# Patient Record
Sex: Female | Born: 1950 | Race: White | Hispanic: No | Marital: Married | Smoking: Former smoker
Health system: Southern US, Community
[De-identification: ages and names within clinical notes are randomized; demographics above are authoritative.]

## PROBLEM LIST (undated history)

## (undated) DIAGNOSIS — IMO0001 Reserved for inherently not codable concepts without codable children: Secondary | ICD-10-CM

## (undated) DIAGNOSIS — M47814 Spondylosis without myelopathy or radiculopathy, thoracic region: Secondary | ICD-10-CM

## (undated) DIAGNOSIS — Z8679 Personal history of other diseases of the circulatory system: Secondary | ICD-10-CM

## (undated) DIAGNOSIS — J45909 Unspecified asthma, uncomplicated: Secondary | ICD-10-CM

## (undated) DIAGNOSIS — M1990 Unspecified osteoarthritis, unspecified site: Secondary | ICD-10-CM

## (undated) DIAGNOSIS — E785 Hyperlipidemia, unspecified: Secondary | ICD-10-CM

## (undated) DIAGNOSIS — I4510 Unspecified right bundle-branch block: Secondary | ICD-10-CM

## (undated) DIAGNOSIS — G5603 Carpal tunnel syndrome, bilateral upper limbs: Secondary | ICD-10-CM

## (undated) DIAGNOSIS — R0600 Dyspnea, unspecified: Secondary | ICD-10-CM

## (undated) DIAGNOSIS — E7800 Pure hypercholesterolemia, unspecified: Secondary | ICD-10-CM

## (undated) DIAGNOSIS — K219 Gastro-esophageal reflux disease without esophagitis: Secondary | ICD-10-CM

## (undated) DIAGNOSIS — I10 Essential (primary) hypertension: Secondary | ICD-10-CM

## (undated) DIAGNOSIS — D649 Anemia, unspecified: Secondary | ICD-10-CM

## (undated) DIAGNOSIS — F32A Depression, unspecified: Secondary | ICD-10-CM

## (undated) DIAGNOSIS — F419 Anxiety disorder, unspecified: Secondary | ICD-10-CM

## (undated) HISTORY — PX: APPENDECTOMY: SHX54

## (undated) HISTORY — PX: COLONOSCOPY: SHX174

## (undated) HISTORY — DX: Gastro-esophageal reflux disease without esophagitis: K21.9

## (undated) HISTORY — DX: Essential (primary) hypertension: I10

## (undated) HISTORY — PX: CHOLECYSTECTOMY: SHX55

## (undated) HISTORY — DX: Reserved for inherently not codable concepts without codable children: IMO0001

## (undated) HISTORY — PX: KNEE SURGERY: SHX244

## (undated) HISTORY — DX: Pure hypercholesterolemia, unspecified: E78.00

## (undated) HISTORY — PX: ANKLE SURGERY: SHX546

---

## 2000-02-12 ENCOUNTER — Other Ambulatory Visit: Admission: RE | Admit: 2000-02-12 | Discharge: 2000-02-12 | Payer: Self-pay | Admitting: Obstetrics and Gynecology

## 2001-03-15 ENCOUNTER — Other Ambulatory Visit: Admission: RE | Admit: 2001-03-15 | Discharge: 2001-03-15 | Payer: Self-pay | Admitting: Obstetrics and Gynecology

## 2001-11-04 ENCOUNTER — Encounter: Payer: Self-pay | Admitting: Pediatrics

## 2001-11-04 ENCOUNTER — Encounter: Admission: RE | Admit: 2001-11-04 | Discharge: 2001-11-04 | Payer: Self-pay | Admitting: Pediatrics

## 2001-11-17 ENCOUNTER — Inpatient Hospital Stay (HOSPITAL_COMMUNITY): Admission: EM | Admit: 2001-11-17 | Discharge: 2001-11-18 | Payer: Self-pay | Admitting: Emergency Medicine

## 2001-11-17 ENCOUNTER — Encounter (INDEPENDENT_AMBULATORY_CARE_PROVIDER_SITE_OTHER): Payer: Self-pay | Admitting: *Deleted

## 2001-11-17 ENCOUNTER — Encounter: Payer: Self-pay | Admitting: Emergency Medicine

## 2001-11-17 ENCOUNTER — Encounter: Payer: Self-pay | Admitting: Surgery

## 2002-03-31 ENCOUNTER — Other Ambulatory Visit: Admission: RE | Admit: 2002-03-31 | Discharge: 2002-03-31 | Payer: Self-pay | Admitting: Obstetrics and Gynecology

## 2002-12-18 ENCOUNTER — Encounter: Admission: RE | Admit: 2002-12-18 | Discharge: 2002-12-18 | Payer: Self-pay | Admitting: Internal Medicine

## 2002-12-18 ENCOUNTER — Encounter: Payer: Self-pay | Admitting: Internal Medicine

## 2003-05-31 ENCOUNTER — Other Ambulatory Visit: Admission: RE | Admit: 2003-05-31 | Discharge: 2003-05-31 | Payer: Self-pay | Admitting: Obstetrics and Gynecology

## 2003-10-02 ENCOUNTER — Ambulatory Visit (HOSPITAL_COMMUNITY): Admission: RE | Admit: 2003-10-02 | Discharge: 2003-10-02 | Payer: Self-pay | Admitting: Orthopedic Surgery

## 2003-10-02 ENCOUNTER — Ambulatory Visit (HOSPITAL_BASED_OUTPATIENT_CLINIC_OR_DEPARTMENT_OTHER): Admission: RE | Admit: 2003-10-02 | Discharge: 2003-10-02 | Payer: Self-pay | Admitting: Orthopedic Surgery

## 2004-07-02 ENCOUNTER — Other Ambulatory Visit: Admission: RE | Admit: 2004-07-02 | Discharge: 2004-07-02 | Payer: Self-pay | Admitting: Obstetrics and Gynecology

## 2005-06-15 ENCOUNTER — Ambulatory Visit (HOSPITAL_COMMUNITY): Admission: RE | Admit: 2005-06-15 | Discharge: 2005-06-15 | Payer: Self-pay | Admitting: Gastroenterology

## 2005-07-06 ENCOUNTER — Other Ambulatory Visit: Admission: RE | Admit: 2005-07-06 | Discharge: 2005-07-06 | Payer: Self-pay | Admitting: Obstetrics and Gynecology

## 2005-08-20 ENCOUNTER — Emergency Department (HOSPITAL_COMMUNITY): Admission: EM | Admit: 2005-08-20 | Discharge: 2005-08-20 | Payer: Self-pay | Admitting: Emergency Medicine

## 2006-09-14 ENCOUNTER — Other Ambulatory Visit: Admission: RE | Admit: 2006-09-14 | Discharge: 2006-09-14 | Payer: Self-pay | Admitting: Obstetrics and Gynecology

## 2007-02-28 ENCOUNTER — Encounter: Admission: RE | Admit: 2007-02-28 | Discharge: 2007-02-28 | Payer: Self-pay | Admitting: Internal Medicine

## 2007-10-26 ENCOUNTER — Other Ambulatory Visit: Admission: RE | Admit: 2007-10-26 | Discharge: 2007-10-26 | Payer: Self-pay | Admitting: Obstetrics and Gynecology

## 2008-02-16 ENCOUNTER — Encounter: Admission: RE | Admit: 2008-02-16 | Discharge: 2008-02-16 | Payer: Self-pay | Admitting: Internal Medicine

## 2009-01-02 ENCOUNTER — Other Ambulatory Visit: Admission: RE | Admit: 2009-01-02 | Discharge: 2009-01-02 | Payer: Self-pay | Admitting: Obstetrics and Gynecology

## 2009-01-02 ENCOUNTER — Ambulatory Visit: Payer: Self-pay | Admitting: Obstetrics and Gynecology

## 2009-01-02 ENCOUNTER — Encounter: Payer: Self-pay | Admitting: Obstetrics and Gynecology

## 2009-04-10 ENCOUNTER — Ambulatory Visit: Payer: Self-pay | Admitting: Obstetrics and Gynecology

## 2009-12-17 ENCOUNTER — Ambulatory Visit: Payer: Self-pay | Admitting: Obstetrics and Gynecology

## 2010-02-12 ENCOUNTER — Encounter: Admission: RE | Admit: 2010-02-12 | Discharge: 2010-02-12 | Payer: Self-pay | Admitting: Internal Medicine

## 2010-02-12 ENCOUNTER — Encounter (INDEPENDENT_AMBULATORY_CARE_PROVIDER_SITE_OTHER): Payer: Self-pay | Admitting: Surgery

## 2010-02-12 ENCOUNTER — Observation Stay (HOSPITAL_COMMUNITY): Admission: EM | Admit: 2010-02-12 | Discharge: 2010-02-13 | Payer: Self-pay | Admitting: Emergency Medicine

## 2010-07-08 ENCOUNTER — Ambulatory Visit: Payer: Self-pay | Admitting: Obstetrics and Gynecology

## 2010-07-08 ENCOUNTER — Other Ambulatory Visit: Admission: RE | Admit: 2010-07-08 | Discharge: 2010-07-08 | Payer: Self-pay | Admitting: Obstetrics and Gynecology

## 2011-03-04 ENCOUNTER — Ambulatory Visit (INDEPENDENT_AMBULATORY_CARE_PROVIDER_SITE_OTHER): Payer: Commercial Managed Care - PPO | Admitting: Obstetrics and Gynecology

## 2011-03-04 DIAGNOSIS — R5381 Other malaise: Secondary | ICD-10-CM

## 2011-03-04 DIAGNOSIS — N951 Menopausal and female climacteric states: Secondary | ICD-10-CM

## 2011-04-24 NOTE — Op Note (Signed)
[name], [name]                           ACCOUNT NO.:  [id number]   MEDICAL RECORD NO.:  [id number]                   PATIENT TYPE:  AMB   LOCATION:  DSC                                  FACILITY:  [facility]   PHYSICIAN:  Leonides Reeves, M.D.                  DATE OF BIRTH:  08-07-1951   DATE OF PROCEDURE:  10/02/2003  DATE OF DISCHARGE:                                 OPERATIVE REPORT   PREOPERATIVE DIAGNOSES:  1. Right anterior distal tibial spur.  2. Right dorsal talar neck spur.  3. Right fibular spur.  4. Right ankle loose body.  5. Right ankle impingement.  6. Right ankle instability.   POSTOPERATIVE DIAGNOSES:  1. Right anterior distal tibial spur.  2. Right dorsal talar neck spur.  3. Right fibular spur.  4. Right ankle loose body.  5. Right ankle impingement.  6. Right ankle instability.   OPERATION/PROCEDURE:  1. Right ankle arthroscopy with extensive debridement.  2. Right excision tibial spurs.  3. Right excision talar spurs.  4. Right excision fibular spurs.  5. Right excision loose body.  6. Right modified Brostrom procedure.   ANESTHESIA:  General endotracheal.   SURGEON:  Leonides Reeves, M.D.   ASSISTANT:  Lianne Cure, P.A.   ESTIMATED BLOOD LOSS:  Minimal.   TOURNIQUET TIME:  Approximately two hours.   COMPLICATIONS:  None.   DISPOSITION:  Stable to PR.   INDICATIONS:  This is a 60 year old very pleasant female who has had  persistent pain and instability involving her ankle to the point that it was  interfering with her life.  She has consented for the above procedure.  All  risks which include infection, nerve and vessel injury, persistent pain,  worsening pain, stiffness, arthritis and recurrence of instability and pain  were all explained.  Questions were encouraged and answered.   OPERATION:  The patient was brought to the operating room and placed in the  supine position after adequate general endotracheal tube anesthesia was  administered as well as Ancef 1 g IV piggyback.  The right lower extremity  was then prepped and draped in a sterile manner over a proximally placed  thigh tourniquet.  The limb was gravity exsanguinated and the tourniquet was  elevated to 290 mmHg.  We then placed about 20 cc of normal saline into the  ankle.  Anatomical landmarks were then marked out, ie., anterior tibialis  and superficial peroneal nerve.  The anteromedial portal was then created  with the nick and spread technique just medial to the anterior tibialis  tendon.  Dissection was carried down, nick and spread technique down through  capsule.  Blunt-tipped trocar followed by the camera was then introduced  under direct visualization.  The anterolateral portal was then created with  the nick and spread technique after a spinal needle was introduced.  There  was a large amount of  synovitis and loose body and as well as osteophytes  present.  The synovitis was then debrided with a shaver.  The loose body was  then removed with a grabber.  The accessory tib-fib ligament was also  rubbing against the corner of the talar dome and this was removed with a  grabber as well.  The anterior distal tibial osteophytes as well as the  dorsal talar osteophytes were removed with the curved 0.25 inch osteotome as  well as a shaver.  Gutters were then debrided as well.  Once this was  completely done and meticulously done, we then removed the scope and then  made a curvilinear incision anterior to the fibula on the right ankle.  Dissection was carried down through skin.  A large amount of adipose tissue  in this area.  This was meticulously dissected out of harm's way.  The  superficial peroneal nerve was identified and retracted out of harms way as  well, a branch of it.  We then did a capsulotomy about 1-2 mm from the  anterior border of the fibula.  The capsule was then removed.  The  osteophytes were then removed off the fibula and  especially off the tip of  the fibula where it was impinging against the lateral as well as and against  the lateral process of the talus.  This was then removed with a curved 0.25  inch osteotome and rongeur.  Once this was done, we then created a bed of  bone against the anterior aspect of the fibula.  Two suture anchors were  then placed in line with the calcaneal fibular ligament as well as the  anterior talofibular ligament.  The capsule was then advanced with the ankle  in dorsiflexion eversion and sutured down with 2-0 Fibrewire after suture  anchors were placed in each hole, respectively.  The remaining portion of  the capsule was then repaired with 2-0 Fibrewire.  There was no significant  extensor retinaculum that we were able to elevate and move over to augment  the repair.  The ankle was then ranged and stability was checked and it was  very stable.  Prior to capsular closure also the joint was copiously  irrigated with normal saline as well.  The tourniquet was deflated.  Hemostasis was obtained. A 3-0 Vicryl was used to close the subcutaneous.  The skin was closed with 4-0 nylon.  Sterile dressing was applied.  Modified  Jones dressing was applied.  The patient was stable to the PR.                                                 Leonides Reeves, M.D.    PB/MEDQ  D:  10/02/2003  T:  10/02/2003  Job:  [id number]

## 2011-04-24 NOTE — Op Note (Signed)
NAMEMIZUKI, HOEL                 ACCOUNT NO.:  [id number]   MEDICAL RECORD NO.:  [id number]          PATIENT TYPE:  AMB   LOCATION:  ENDO                         FACILITY:  [facility]   PHYSICIAN:  Danise Edge, M.D.   DATE OF BIRTH:  [birth year]   DATE OF PROCEDURE:  06/15/2005  DATE OF DISCHARGE:                                 OPERATIVE REPORT   PROCEDURE:  Screening colonoscopy.   PROCEDURE INDICATIONS:  [name] is a 60 year old female born Nov 11, 1951. Ms. [name] is scheduled to undergo her first screening colonoscopy  with polypectomy to prevent colon cancer. I discussed with her the  complications associated with colonoscopy and polypectomy including a 15 per  1000 risk of bleeding and 1 per 1000 risk of colon perforation. Ms. [name] has  signed the operative permit.   ENDOSCOPIST:  Danise Edge, M.D.   PREMEDICATION:  Versed 10 mg, Demerol 80 mg.   PROCEDURE:  After obtaining informed consent, Ms. [name] was placed in the left  lateral decubitus position. I administered intravenous Demerol and  intravenous Versed to achieve conscious sedation for the procedure. The  patient's blood pressure, oxygen saturation, and cardiac rhythm were  monitored throughout the procedure and documented in the medical record.   Anal inspection and digital rectal exam were normal. The Olympus adjustable  pediatric colonoscope was introduced into the rectum and advanced to the  cecum. Colonic preparation for the exam today was excellent.   Rectum normal. Retroflexed view of the distal rectum normal.  Sigmoid colon and descending colon. Sigmoid colonic diverticulosis without  diverticulitis. No neoplasia.  Splenic flexure normal.  Transverse colon normal.  Hepatic flexure normal.  Ascending colon normal.  Cecum and ileocecal valve normal.   ASSESSMENT:  Normal screening proctocolonoscopy to cecum. Sigmoid colonic  diverticulosis present.       MJ/MEDQ  D:  06/15/2005  T:   06/15/2005  Job:  [id number]   cc:   Tiffany Housekeeper, MD  [address]  [place]  [place] [address]  Fax: [phone]

## 2011-04-24 NOTE — H&P (Signed)
[facility]  Patient:    BLESSING, ZAUCHA Visit Number: [id number] MRN: [id number]          Service Type: SUR Location: 1E 0110 01 Attending Physician:  Donnetta Hutching Dictated by:   Currie Paris, M.D. Admit Date:  11/17/2001                           History and Physical  VISIT NUMBER:  [id number]  CHIEF COMPLAINT:  Abdominal pain.  CLINICAL HISTORY:  This patient is a healthy 60 year old woman who has presented with an acute episode of abdominal pain.  It woke her up about 3 a.m., although the evening prior she had a little bit of mild upper abdominal discomfort and indigestion, but was fine when she went to bed.  She woke up at 3 a.m. with sharp severe pain that was subxiphoid and radiated down into the right upper quadrant and around to her back.  It was associated with some nausea and one episode of emesis about three hours after the pain began. She presented to the emergency room about 7:45 a.m. and subsequently the pain resolved spontaneously without need for pain medication.  She still feels a little queasy.  The patient has had some mild symptoms in the past and has been diagnosed with a hiatal hernia and reflux.  A couple of years ago, she had to have an esophageal dilation.  However, she really has not had too much in the way of biliary colic-type symptoms.  PAST MEDICAL HISTORY:  Operations:  None.  MEDICATIONS:  She is on two blood pressure pills.  She takes occasional Pepcid type drugs as well.  ALLERGIES:  CODEINE makes her feel funny.  HABITS:  Smoking:  None.  Alcohol:  None.  FAMILY HISTORY:  The patients mother is an insulin-dependent diabetic.  REVIEW OF SYSTEMS:  HEENT:  Basically negative.  Chest:  No cough or shortness of breath.  Heart:  History of some  hypertension, otherwise negative. Abdomen:  Negative, except for HPI.  GU:  Negative.  Extremities:  Negative.  PHYSICAL EXAMINATION:  The patient is a  healthy-appearing, 60 year old, alert, awake, and in no distress.  HEENT:  Normocephalic.  Eyes nonicteric.  Pupils round and reactive.  NECK:  Supple.  No masses or thyromegaly.  LUNGS:  Clear to auscultation.  HEART:  Regular.  No murmurs, rubs, or gallops.  ABDOMEN:  Soft, flat.  Perhaps a little involuntary guarding in the right upper quadrant, but the patient denies tenderness.  Bowel sounds are normal. There are no masses or organomegaly.  EXTREMITIES:  She does have some varicosities.  LABORATORY STUDIES:  These show a specific gravity of 1.025.  The white count is 13,000.  The hemoglobin is normal.  Liver functions are normal.  The ultrasound of the gallbladder shows what appears to be a fairly large stone perhaps at the neck of the gallbladder.  The common duct is "plump" at 9 mm.  There appears to be a little thickening of the gallbladder wall.  The gallbladder is not particularly dilated.  IMPRESSION:  Acute biliary colic with probably developing cholecystitis given the white count and thickened wall.  PLAN:  I think we ought to go ahead with laparoscopic cholecystectomy today. I have discussed the indications, risks, and complications of the surgery, including bleeding, infections, conversion to open, bile duct injuries, bile leaks, etc.  I think all questions have been answered.  She  would like to go ahead and proceed to surgery.  We will go ahead and make those arrangements, start her on some antibiotics preoperatively, and proceed. Dictated by:   Currie Paris, M.D. Attending Physician:  Donnetta Hutching DD:  11/17/01 TD:  11/17/01 Job: [id number]

## 2011-04-24 NOTE — Op Note (Signed)
[facility]  Patient:    [name], [name] Visit Number: [id number] MRN: [id number]          Service Type: SUR Location: 4W 0445 02 Attending Physician:  Charlton Haws Dictated by:   Currie Paris, M.D. Proc. Date: 11/17/01 Admit Date:  11/17/2001                             Operative Report  PREOPERATIVE DIAGNOSIS:  Acute calculous cholecystitis.  POSTOPERATIVE DIAGNOSIS:  Acute calculous cholecystitis.  OPERATION PERFORMED:  Laparoscopic cholecystectomy with the operative cholangiogram.  SURGEON:  Currie Paris, M.D.  ASSISTANT:  Dr. Ezzard Standing.  ANESTHESIA:  General endotracheal.  CLINICAL HISTORY:  This patient is a 60 year old who presented with an episode of acute biliary pain this morning that began at 3 a.m. and woke her up. Evaluation in the emergency room showed normal liver functions and her pain spontaneously went away; however, her ultrasound showed a single large stone that looked like it was in the neck of the gallbladder and slight thickening of the gallbladder wall and she had a white count of 13,000. It was elected to start her on some antibiotics and take her to the operating room for cholecystectomy.  DESCRIPTION OF PROCEDURE:  The patient was brought to the operating room and after satisfactory general endotracheal anesthesia had been obtained, the abdomen was prepped and draped. The 0.25% plain Marcaine was used for each incision. The umbilical incision was made first, the fascia opened, and the peritoneal cavity entered. A pursestring was placed and the Hasson introduced. The abdomen was insufflated to 15.  The patient was placed reverse Trendelenburg and tilted to the left. A 10 mm trocar was placed in the epigastrium and two 5s laterally. The gallbladder was tense and edematous but not severely inflamed consistent with an early obstruction of the cystic duct and early inflammation.  The cystic duct  area was opened up opening the peritoneum and the triangle of Calot dissected out so that I could clearly see the cystic duct in its junction with the common duct and gallbladder. The cystic artery was seen posteriorly and a single clip was placed on it once the anatomy had been identified.  Because the common duct looked somewhat large on ultrasound, we decided to go ahead with the cholangiogram. A clip was placed at the cystic duct gallbladder junction and the cystic duct was opened. A Reddick catheter was introduced and operative cholangiography was done using C-arm. There was good flow into the duodenum, good filling of the hepatic radicles and no evidence of any intraluminal filling defects.  The cystic duct catheter was removed and two clips placed on the cystic duct and it was divided. The cystic artery had two additional clips placed and it was divided leaving two clips on the stay side. The gallbladder was removed from below to above with coagulation current of the cautery checking as we went to make sure there was no bleeding along the bed of the gallbladder.  Just prior to disconnecting, we irrigated and began checking for hemostasis and everything appeared dry. The gallbladder was disconnected and put into an endocatch bag. It was brought out the umbilical port.  The abdomen with reinsufflated with my finger occluding the umbilical port so that we could again make a last check for bleeding a last irrigation. Everything looked okay. The lateral ports were removed and did not bleed. The umbilical port was  closed with the previously placed pursestring and was free of any bowel. The abdomen was deflated through the epigastric port.  The skin incisions were closed with 4-0 monocryl subcuticular plus Steri-Strips. The patient tolerated the procedure well. There were no operative complications. Dictated by:   Currie Paris, M.D. Attending Physician:  Charlton Haws DD:  11/17/01 TD:  11/18/01 Job: [id number]

## 2011-09-09 ENCOUNTER — Telehealth: Payer: Self-pay | Admitting: *Deleted

## 2011-09-09 NOTE — Telephone Encounter (Signed)
Lm for patient to call back about message she left about getting HRT.

## 2011-09-10 ENCOUNTER — Telehealth: Payer: Self-pay | Admitting: *Deleted

## 2011-09-10 MED ORDER — ESTRADIOL 0.05 MG/24HR TD PTWK: 1.0000 | MEDICATED_PATCH | TRANSDERMAL | Status: DC

## 2011-09-10 MED ORDER — MEDROXYPROGESTERONE ACETATE 5 MG PO TABS: ORAL_TABLET | ORAL | Status: DC

## 2011-09-10 NOTE — Telephone Encounter (Signed)
Patient was calling about two rx's that was given to her on 03/04/11 visit.  They were Climara 0.05 patches and Medroxyprogesterone 5mg  daily 1-12. Patient said she never started them, and lost rx's.  Patient is ready to start them.  Has an annual exam set up for November, and wants to know if we could call in rx's till annual exam and see if they would work for her, if not it could be changed when she comes in for annual exam. Is that ok?

## 2011-09-10 NOTE — Telephone Encounter (Signed)
Lm on patient voice mail with note below with instructions.

## 2011-09-10 NOTE — Telephone Encounter (Signed)
yes

## 2011-10-08 ENCOUNTER — Encounter: Payer: Self-pay | Admitting: Gynecology

## 2011-10-08 DIAGNOSIS — I10 Essential (primary) hypertension: Secondary | ICD-10-CM | POA: Insufficient documentation

## 2011-10-26 ENCOUNTER — Encounter: Payer: Commercial Managed Care - PPO | Admitting: Obstetrics and Gynecology

## 2011-12-07 ENCOUNTER — Ambulatory Visit (INDEPENDENT_AMBULATORY_CARE_PROVIDER_SITE_OTHER): Payer: Commercial Managed Care - PPO | Admitting: Obstetrics and Gynecology

## 2011-12-07 ENCOUNTER — Other Ambulatory Visit (HOSPITAL_COMMUNITY)
Admission: RE | Admit: 2011-12-07 | Discharge: 2011-12-07 | Disposition: A | Discharge status: Home / Self Care | Payer: Commercial Managed Care - PPO | Source: Ambulatory Visit | Attending: Obstetrics and Gynecology | Admitting: Obstetrics and Gynecology

## 2011-12-07 ENCOUNTER — Encounter: Payer: Self-pay | Admitting: Obstetrics and Gynecology

## 2011-12-07 VITALS — BP 120/76 | Ht 64.5 in | Wt 203.0 lb

## 2011-12-07 DIAGNOSIS — Z01419 Encounter for gynecological examination (general) (routine) without abnormal findings: Secondary | ICD-10-CM

## 2011-12-07 DIAGNOSIS — E7800 Pure hypercholesterolemia, unspecified: Secondary | ICD-10-CM | POA: Insufficient documentation

## 2011-12-07 MED ORDER — MEDROXYPROGESTERONE ACETATE 2.5 MG PO TABS: 2.5000 mg | ORAL_TABLET | Freq: Every day | ORAL | Status: DC

## 2011-12-07 MED ORDER — ESTRADIOL 0.05 MG/24HR TD PTWK: 1.0000 | MEDICATED_PATCH | TRANSDERMAL | Status: DC

## 2011-12-07 NOTE — Progress Notes (Signed)
The patient came to see me today for her annual GYN exam. She is found a significant reduction in hot flashes on her HRT but it has not helped her fatigue which she was hoping it would. She is not happy with the heaviness of her periods on cyclical progesterone. She continues with yearly mammograms. She is having no pelvic pain. She does her lab work from her PCP.  Physical examination: [name] present. HEENT within normal limits. Neck: Thyroid not large. No masses. Supraclavicular nodes: not enlarged. Breasts: Examined in both sitting midline position. No skin changes and no masses. Abdomen: Soft no guarding rebound or masses or hernia. Pelvic: External: Within normal limits. BUS: Within normal limits. Vaginal:within normal limits. Good estrogen effect. No evidence of cystocele rectocele or enterocele. Cervix: clean. Uterus: Normal size and shape. Adnexa: No masses. Rectovaginal exam: Confirmatory and negative. Extremities: Within normal limits.  Assessment: Menopausal symptoms  Plan: Patient would like to continue HRT for hot flashes relief. We switched her to a continuous progestin 2.5 mg daily to eliminate the bleeding. She will continue her yearly mammograms.

## 2011-12-15 NOTE — Progress Notes (Signed)
Addended by: Melvenia Beam on: 12/15/2011 08:45 AM   Modules accepted: Orders

## 2012-08-19 ENCOUNTER — Telehealth: Payer: Self-pay | Admitting: *Deleted

## 2012-08-19 ENCOUNTER — Other Ambulatory Visit: Payer: Self-pay | Admitting: Women's Health

## 2012-08-19 DIAGNOSIS — B373 Candidiasis of vulva and vagina: Secondary | ICD-10-CM

## 2012-08-19 MED ORDER — FLUCONAZOLE 150 MG PO TABS: 150.0000 mg | ORAL_TABLET | Freq: Once | ORAL | Status: AC

## 2012-08-19 NOTE — Telephone Encounter (Signed)
(  Dr.G patient) Pt calling c/o yeast infection after being on antibody, pt cant make OV today and would like something to relieve over weekend. If no relief they OV will be made for next. Please advise

## 2012-08-19 NOTE — Telephone Encounter (Signed)
Unable to breech, left message on cell Diflucan will be called into her pharmacy. Encouraged office visit if no relief.

## 2012-08-22 NOTE — Telephone Encounter (Signed)
Telephone call for followup states did pick up prescription for Diflucan and is feeling better. Reviewed if symptoms not 100% office visit prefer to.

## 2012-10-07 ENCOUNTER — Encounter: Payer: Self-pay | Admitting: Cardiology

## 2013-01-13 ENCOUNTER — Encounter: Payer: Commercial Managed Care - PPO | Admitting: Gynecology

## 2013-02-22 ENCOUNTER — Encounter: Payer: Commercial Managed Care - PPO | Admitting: Gynecology

## 2013-03-28 ENCOUNTER — Telehealth: Payer: Self-pay

## 2013-03-28 ENCOUNTER — Encounter: Payer: Self-pay | Admitting: Gynecology

## 2013-03-28 NOTE — Telephone Encounter (Signed)
I called patient again. This time we were able to talk. 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  She is scheduled for hip replacement next month and too much pain for that position. She said she can get on the table with no problem and lying back no problem. I told her to come on for visit and just let her nurse know this.  Dr. Glenetta Hew will do what he can do with what she is comfortable doing.

## 2013-03-28 NOTE — Telephone Encounter (Signed)
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

## 2013-03-28 NOTE — Telephone Encounter (Signed)
Patient called stating she had a question.  Sherrilyn Rist called her several times and left message for her to call. She is in my voice mail at this point.

## 2013-03-30 ENCOUNTER — Other Ambulatory Visit: Payer: Self-pay | Admitting: Gynecology

## 2013-03-30 ENCOUNTER — Ambulatory Visit (INDEPENDENT_AMBULATORY_CARE_PROVIDER_SITE_OTHER): Payer: Commercial Managed Care - PPO | Admitting: Gynecology

## 2013-03-30 ENCOUNTER — Encounter: Payer: Self-pay | Admitting: Gynecology

## 2013-03-30 ENCOUNTER — Other Ambulatory Visit (HOSPITAL_COMMUNITY)
Admission: RE | Admit: 2013-03-30 | Discharge: 2013-03-30 | Disposition: A | Discharge status: Home / Self Care | Payer: Commercial Managed Care - PPO | Source: Ambulatory Visit | Attending: Gynecology | Admitting: Gynecology

## 2013-03-30 VITALS — BP 130/86 | Ht 64.5 in | Wt 205.0 lb

## 2013-03-30 DIAGNOSIS — Z01419 Encounter for gynecological examination (general) (routine) without abnormal findings: Secondary | ICD-10-CM | POA: Insufficient documentation

## 2013-03-30 DIAGNOSIS — Z1151 Encounter for screening for human papillomavirus (HPV): Secondary | ICD-10-CM | POA: Insufficient documentation

## 2013-03-30 DIAGNOSIS — N950 Postmenopausal bleeding: Secondary | ICD-10-CM | POA: Insufficient documentation

## 2013-03-30 NOTE — Patient Instructions (Addendum)

## 2013-03-30 NOTE — Progress Notes (Signed)
[name] November 14, 1951 [id number]   History:    62 y.o.  for annual gyn exam who has been complaining of postmenopausal bleeding. Patient was on hormone replacement therapy for 2 months early in 2013 and discontinued because of heavy bleeding. She stated that since the fall of 2013 on no hormone replacement therapy when she wiped she did notice a spotting. Review of her record indicated that her FSH was elevated in 2011 with a value of 40. Her primary care physician is Dr. Eula Listen who is doing her lab work. Patient scheduled for a right hip replacement later next month. Patient would know prior bone density study. Last mammogram was normal in 2013. Patient's last Pap smear in 2012. Patient denies any past history of abnormal Pap smears.  Past medical history,surgical history, family history and social history were all reviewed and documented in the EPIC chart.  Gynecologic History No LMP recorded. Patient is postmenopausal. Contraception: post menopausal status Last Pap: 2012. Results were: normal Last mammogram: 2013. Results were: normal  Obstetric History OB History   Grav Para Term Preterm Abortions TAB SAB Ect Mult Living   1 1 1       1      # Outc Date GA Lbr Len/2nd Wgt Sex Del Anes PTL Lv   1 TRM                ROS: A ROS was performed and pertinent positives and negatives are included in the history.  GENERAL: No fevers or chills. HEENT: No change in vision, no earache, sore throat or sinus congestion. NECK: No pain or stiffness. CARDIOVASCULAR: No chest pain or pressure. No palpitations. PULMONARY: No shortness of breath, cough or wheeze. GASTROINTESTINAL: No abdominal pain, nausea, vomiting or diarrhea, melena or bright red blood per rectum. GENITOURINARY: No urinary frequency, urgency, hesitancy or dysuria. MUSCULOSKELETAL: No joint or muscle pain, no back pain, no recent trauma. DERMATOLOGIC: No rash, no itching, no lesions. ENDOCRINE: No polyuria, polydipsia, no heat or cold  intolerance. No recent change in weight. HEMATOLOGICAL: No anemia or easy bruising or bleeding. NEUROLOGIC: No headache, seizures, numbness, tingling or weakness. PSYCHIATRIC: No depression, no loss of interest in normal activity or change in sleep pattern.     Exam: chaperone present  BP 130/86  Ht 5' 4.5" (1.638 m)  Wt 205 lb (92.987 kg)  BMI 34.66 kg/m2  Body mass index is 34.66 kg/(m^2).  General appearance : Well developed well nourished female. No acute distress HEENT: Neck supple, trachea midline, no carotid bruits, no thyroidmegaly Lungs: Clear to auscultation, no rhonchi or wheezes, or rib retractions  Heart: Regular rate and rhythm, no murmurs or gallops Breast:Examined in sitting and supine position were symmetrical in appearance, no palpable masses or tenderness,  no skin retraction, no nipple inversion, no nipple discharge, no skin discoloration, no axillary or supraclavicular lymphadenopathy Abdomen: no palpable masses or tenderness, no rebound or guarding Extremities: no edema or skin discoloration or tenderness  Pelvic:  Bartholin, Urethra, Skene Glands: Within normal limits             Vagina: No gross lesions or discharge, atrophic changes  Cervix: No gross lesions or discharge  Uterus  anteverted, normal size, shape and consistency, non-tender and mobile  Adnexa  Without masses or tenderness  Anus and perineum  normal   Rectovaginal  normal sphincter tone without palpated masses or tenderness             Hemoccult  Will be provided  at a later date because of her postmenopausal bleeding.   Patient was counseled for an endometrial biopsy after her Pap smear was obtained. The cervix was cleansed with Betadine solution. A single-tooth tenaculum was placed on the anterior cervical lip. The uterus sounded to 6-1/2 cm. A sterile Pipelle was introduced into the intrauterine cavity and minimal tissue obtained. The little bit of tissue obtained was submitted for histological  evaluation. Single-tooth tenaculum was removed. Patient was given Aleve for her cramping.  Assessment/Plan:  62 y.o. female for annual exam with postmenopausal bleeding underwent endometrial biopsy today. Results of endometrial biopsy pending at time of this dictation. To complete evaluation patient will return to the office in 1-2 weeks for a sonohysterogram. Pap smear was done today. Her primary physician will be doing her blood work. She is to check when her last mammogram was done so that she can schedule one for this year. Her colonoscopy is up-to-date.    Ok Edwards MD, 3:58 PM 03/30/2013

## 2013-03-30 NOTE — Addendum Note (Signed)
Addended by: Bertram Savin A on: 03/30/2013 04:07 PM   Modules accepted: Orders

## 2013-04-03 ENCOUNTER — Telehealth: Payer: Self-pay

## 2013-04-03 ENCOUNTER — Other Ambulatory Visit: Payer: Self-pay | Admitting: Gynecology

## 2013-04-03 MED ORDER — MEGESTROL ACETATE 40 MG PO TABS: 40.0000 mg | ORAL_TABLET | Freq: Two times a day (BID) | ORAL | Status: DC

## 2013-04-03 NOTE — Telephone Encounter (Signed)
At patient's request I left detailed message on cell phone voice mail.  I e-scribed Megace to her pharmacy and let her know that as well.  Told her to call if any questions.

## 2013-04-03 NOTE — Telephone Encounter (Signed)
You can call her in Megace 40 mg to take 1 by mouth twice a day for 7 days #14. The sonohysterogram will take a few minutes in should not alter any of her plans for hip replacement. But if she rather wait till after work that will be fine now that we know at least that her pathology report was benign from the endometrial biopsy.

## 2013-04-03 NOTE — Telephone Encounter (Signed)
Patient called with two questions:  1.  She has been bleeding since the Thursday when she had endo bx.  She was told she might spot after but this is a flow and she has had some clots.  She said more than a panty liner can handle so she went and bought some pads.  She asked if normal?  2.  She asked if you think it would be okay if she postpones [facility] for a couple of mos until after her hip replacement surgery in May?

## 2013-04-17 ENCOUNTER — Encounter (HOSPITAL_COMMUNITY): Payer: Self-pay | Admitting: Pharmacy Technician

## 2013-04-25 ENCOUNTER — Other Ambulatory Visit (HOSPITAL_COMMUNITY): Payer: Self-pay | Admitting: Orthopedic Surgery

## 2013-04-25 NOTE — Patient Instructions (Addendum)
[address]  04/25/2013   Your procedure is scheduled on: 05-02-2013  Report to [facility] at 725 AM.  Call this number if you have problems the morning of surgery [phone]   Remember:   Do not eat food or drink liquids :After Midnight.     Take these medicines the morning of surgery with A SIP OF WATER: diltiazem, albuterol inhaler if needed, alprazolam if needed, cymbalta, omeprazole                                SEE [place] PREPARING FOR SURGERY SHEET   Do not wear jewelry, make-up or nail polish.  Do not wear lotions, powders, or perfumes. You may wear deodorant.   Men may shave face and neck.  Do not bring valuables to the hospital.  Contacts, dentures or bridgework may not be worn into surgery.  Leave suitcase in the car. After surgery it may be brought to your room.  For patients admitted to the hospital, checkout time is 11:00 AM the day of discharge.   Patients discharged the day of surgery will not be allowed to drive home.  Name and phone number of your driver:  Special Instructions: N/A   Please read over the following fact sheets that you were given: MRSA Information., blood fact sheet, incentive spirometer fact sheet Call Cain Sieve RN pre op nurse if needed [phone]    FAILURE TO FOLLOW THESE INSTRUCTIONS MAY RESULT IN THE CANCELLATION OF YOUR SURGERY. PATIENT SIGNATURE___________________________________________

## 2013-04-25 NOTE — Progress Notes (Signed)
Lov/clearance note dr Donette Larry 04-05-3013 on chart ekg 04-05-3013 dr Donette Larry on chart

## 2013-04-26 ENCOUNTER — Encounter (HOSPITAL_COMMUNITY): Payer: Self-pay

## 2013-04-26 ENCOUNTER — Ambulatory Visit (HOSPITAL_COMMUNITY)
Admission: RE | Admit: 2013-04-26 | Discharge: 2013-04-26 | Disposition: A | Discharge status: Home / Self Care | Payer: Commercial Managed Care - PPO | Source: Ambulatory Visit | Attending: Orthopedic Surgery | Admitting: Orthopedic Surgery

## 2013-04-26 ENCOUNTER — Encounter (HOSPITAL_COMMUNITY)
Admission: RE | Admit: 2013-04-26 | Discharge: 2013-04-26 | Disposition: A | Discharge status: Home / Self Care | Payer: Commercial Managed Care - PPO | Source: Ambulatory Visit | Attending: Orthopedic Surgery | Admitting: Orthopedic Surgery

## 2013-04-26 DIAGNOSIS — Z01812 Encounter for preprocedural laboratory examination: Secondary | ICD-10-CM | POA: Insufficient documentation

## 2013-04-26 DIAGNOSIS — I10 Essential (primary) hypertension: Secondary | ICD-10-CM | POA: Insufficient documentation

## 2013-04-26 DIAGNOSIS — Z87891 Personal history of nicotine dependence: Secondary | ICD-10-CM | POA: Insufficient documentation

## 2013-04-26 DIAGNOSIS — R05 Cough: Secondary | ICD-10-CM | POA: Insufficient documentation

## 2013-04-26 DIAGNOSIS — Z01818 Encounter for other preprocedural examination: Secondary | ICD-10-CM | POA: Insufficient documentation

## 2013-04-26 DIAGNOSIS — R059 Cough, unspecified: Secondary | ICD-10-CM | POA: Insufficient documentation

## 2013-04-26 HISTORY — DX: Anemia, unspecified: D64.9

## 2013-04-26 HISTORY — DX: Personal history of other diseases of the circulatory system: Z86.79

## 2013-04-26 HISTORY — DX: Unspecified osteoarthritis, unspecified site: M19.90

## 2013-04-26 HISTORY — DX: Gastro-esophageal reflux disease without esophagitis: K21.9

## 2013-04-26 LAB — SURGICAL PCR SCREEN: Staphylococcus aureus: POSITIVE — AB

## 2013-04-26 LAB — URINALYSIS, ROUTINE W REFLEX MICROSCOPIC
Bilirubin Urine: NEGATIVE
Hgb urine dipstick: NEGATIVE
Ketones, ur: NEGATIVE mg/dL
Nitrite: NEGATIVE
pH: 6 (ref 5.0–8.0)

## 2013-04-26 LAB — CBC
Hemoglobin: 13.8 g/dL (ref 12.0–15.0)
MCHC: 32.9 g/dL (ref 30.0–36.0)
Platelets: 341 10*3/uL (ref 150–400)
RBC: 4.99 MIL/uL (ref 3.87–5.11)

## 2013-04-26 LAB — BASIC METABOLIC PANEL
Calcium: 10.2 mg/dL (ref 8.4–10.5)
GFR calc Af Amer: >90 mL/min (ref >90)
GFR calc non Af Amer: >90 mL/min (ref >90)
Potassium: 3.7 mEq/L (ref 3.5–5.1)
Sodium: 139 mEq/L (ref 135–145)

## 2013-04-26 LAB — ABO/RH: ABO/RH(D): A POS

## 2013-04-26 LAB — PROTIME-INR
INR: 1.07 (ref 0.00–1.49)
Prothrombin Time: 13.8 seconds (ref 11.6–15.2)

## 2013-04-26 LAB — HCG, SERUM, QUALITATIVE: Preg, Serum: NEGATIVE

## 2013-04-26 NOTE — Progress Notes (Signed)
04/26/13 0807  OBSTRUCTIVE SLEEP APNEA  Have you ever been diagnosed with sleep apnea through a sleep study? No  Do you snore loudly (loud enough to be heard through closed doors)?  1  Do you often feel tired, fatigued, or sleepy during the daytime? 1  Has anyone observed you stop breathing during your sleep? 0  Do you have, or are you being treated for high blood pressure? 1  BMI more than 35 kg/m2? 0  Age over 61 years old? 1  Neck circumference greater than 40 cm/18 inches? 0  Gender: 0  Obstructive Sleep Apnea Score 4  Score 4 or greater  Results sent to PCP

## 2013-04-30 NOTE — H&P (Signed)
TOTAL HIP ADMISSION H&P  Patient is admitted for right total hip arthroplasty, anterior approach.  Subjective:  Chief Complaint: right hip OA / pain  HPI: [name], 62 y.o. female, has a history of pain and functional disability in the right hip(s) due to arthritis and patient has failed non-surgical conservative treatments for greater than 12 weeks to include NSAID's and/or analgesics, corticosteriod injections and activity modification.  Onset of symptoms was gradual starting 2 years ago with rapidlly worsening course since that time.The patient noted no past surgery on the right hip(s).  Patient currently rates pain in the right hip at 10 out of 10 with activity. Patient has night pain, worsening of pain with activity and weight bearing, trendelenberg gait, pain that interfers with activities of daily living, pain with passive range of motion and feelsing of the hip giving away on her. Patient has evidence of periarticular osteophytes and joint space narrowing by imaging studies. This condition presents safety issues increasing the risk of falls. There is no current active infection.  Risks, benefits and expectations were discussed with the patient. Patient understand the risks, benefits and expectations and wishes to proceed with surgery.   D/C Plans:   Home with HHPT  Post-op Meds:    No Rx given  Tranexamic Acid:   To be given  Decadron:    To be given  FYI:   ASA post-op   Patient Active Problem List   Diagnosis Date Noted  . Postmenopausal bleeding 03/30/2013  . Elevated cholesterol   . Reflux   . Hypertension    Past Medical History  Diagnosis Date  . Reflux   . Hypertension   . Elevated cholesterol   . Hx of right bundle branch block last 5 yrs  . GERD (gastroesophageal reflux disease)   . Arthritis   . Anemia yrs ago    hx of    Past Surgical History  Procedure Laterality Date  . Ankle surgery Right 7- 10 yrs ago  . Knee surgery Bilateral  60yrs ago     Arthroscopic  . Appendectomy  3 yrs ago  . Cholecystectomy      No prescriptions prior to admission   Allergies  Allergen Reactions  . Codeine Palpitations    History  Substance Use Topics  . Smoking status: Former Smoker -- 1.00 packs/day for 10 years    Types: Cigarettes    Quit date: 03/31/1979  . Smokeless tobacco: Never Used  . Alcohol Use: Yes     Comment: very rare    Family History  Problem Relation Age of Onset  . Diabetes Mother   . Hypertension Mother   . Breast cancer Mother 23  . Hypertension Father   . Diabetes Maternal Grandmother   . Colon cancer Maternal Grandfather   . Cancer Maternal Grandfather     rectal-colon  . Heart disease Paternal Grandmother   . Heart disease Paternal Grandfather      Review of Systems  Constitutional: Negative.   HENT: Negative.   Eyes: Negative.   Respiratory: Negative.   Cardiovascular: Negative.   Gastrointestinal: Positive for heartburn.  Musculoskeletal: Positive for joint pain.  Skin: Negative.   Neurological: Negative.   Endo/Heme/Allergies: Negative.   Psychiatric/Behavioral: Negative.     Objective:  Physical Exam  Constitutional: She is oriented to person, place, and time. She appears well-developed and well-nourished.  HENT:  Head: Normocephalic and atraumatic.  Mouth/Throat: Oropharynx is clear and moist.  Eyes: Pupils are equal, round, and  reactive to light.  Neck: Neck supple. No JVD present. No tracheal deviation present. No thyromegaly present.  Cardiovascular: Normal rate, regular rhythm, normal heart sounds and intact distal pulses.   Respiratory: Effort normal and breath sounds normal. No stridor. No respiratory distress. She has no wheezes.  GI: Soft. There is no tenderness. There is no guarding.  Musculoskeletal:       Right hip: She exhibits decreased range of motion, decreased strength, tenderness and bony tenderness. She exhibits no swelling, no deformity and no laceration.   Lymphadenopathy:    She has no cervical adenopathy.  Neurological: She is alert and oriented to person, place, and time.  Skin: Skin is warm and dry.  Psychiatric: She has a normal mood and affect.     Labs:  Estimated body mass index is 34.32 kg/(m^2) as calculated from the following:   Height as of 12/07/11: 5' 4.5" (1.638 m).   Weight as of 12/07/11: 92.08 kg (203 lb).   Imaging Review Plain radiographs demonstrate severe degenerative joint disease of the right hip(s). The bone quality appears to be good for age and reported activity level.  Assessment/Plan:  End stage arthritis, right hip(s)  The patient history, physical examination, clinical judgement of the provider and imaging studies are consistent with end stage degenerative joint disease of the right hip(s) and total hip arthroplasty is deemed medically necessary. The treatment options including medical management, injection therapy, arthroscopy and arthroplasty were discussed at length. The risks and benefits of total hip arthroplasty were presented and reviewed. The risks due to aseptic loosening, infection, stiffness, dislocation/subluxation,  thromboembolic complications and other imponderables were discussed.  The patient acknowledged the explanation, agreed to proceed with the plan and consent was signed. Patient is being admitted for inpatient treatment for surgery, pain control, PT, OT, prophylactic antibiotics, VTE prophylaxis, progressive ambulation and ADL's and discharge planning.The patient is planning to be discharged home with home health services.    Anastasio Auerbach Torri Langston   PAC  04/30/2013, 9:52 AM

## 2013-05-02 ENCOUNTER — Encounter (HOSPITAL_COMMUNITY): Payer: Self-pay | Admitting: Anesthesiology

## 2013-05-02 ENCOUNTER — Inpatient Hospital Stay (HOSPITAL_COMMUNITY)
Admission: RE | Admit: 2013-05-02 | Discharge: 2013-05-04 | DRG: 470 | Disposition: A | Discharge status: Home Health | Payer: Commercial Managed Care - PPO | Source: Ambulatory Visit | Attending: Orthopedic Surgery | Admitting: Orthopedic Surgery

## 2013-05-02 ENCOUNTER — Inpatient Hospital Stay (HOSPITAL_COMMUNITY): Payer: Commercial Managed Care - PPO

## 2013-05-02 ENCOUNTER — Encounter (HOSPITAL_COMMUNITY)
Admission: RE | Disposition: A | Discharge status: Home Health | Payer: Self-pay | Source: Ambulatory Visit | Attending: Orthopedic Surgery

## 2013-05-02 ENCOUNTER — Inpatient Hospital Stay (HOSPITAL_COMMUNITY): Payer: Commercial Managed Care - PPO | Admitting: Anesthesiology

## 2013-05-02 ENCOUNTER — Encounter (HOSPITAL_COMMUNITY): Payer: Self-pay | Admitting: *Deleted

## 2013-05-02 DIAGNOSIS — D500 Iron deficiency anemia secondary to blood loss (chronic): Secondary | ICD-10-CM | POA: Diagnosis not present

## 2013-05-02 DIAGNOSIS — M169 Osteoarthritis of hip, unspecified: Principal | ICD-10-CM | POA: Diagnosis present

## 2013-05-02 DIAGNOSIS — Z87891 Personal history of nicotine dependence: Secondary | ICD-10-CM

## 2013-05-02 DIAGNOSIS — K219 Gastro-esophageal reflux disease without esophagitis: Secondary | ICD-10-CM | POA: Diagnosis present

## 2013-05-02 DIAGNOSIS — I10 Essential (primary) hypertension: Secondary | ICD-10-CM | POA: Diagnosis present

## 2013-05-02 DIAGNOSIS — E669 Obesity, unspecified: Secondary | ICD-10-CM | POA: Diagnosis present

## 2013-05-02 DIAGNOSIS — Z96649 Presence of unspecified artificial hip joint: Secondary | ICD-10-CM

## 2013-05-02 DIAGNOSIS — M1610 Unilateral primary osteoarthritis, unspecified hip: Principal | ICD-10-CM | POA: Diagnosis present

## 2013-05-02 DIAGNOSIS — Z6834 Body mass index (BMI) 34.0-34.9, adult: Secondary | ICD-10-CM

## 2013-05-02 DIAGNOSIS — D62 Acute posthemorrhagic anemia: Secondary | ICD-10-CM | POA: Diagnosis not present

## 2013-05-02 HISTORY — PX: TOTAL HIP ARTHROPLASTY: SHX124

## 2013-05-02 LAB — TYPE AND SCREEN

## 2013-05-02 SURGERY — ARTHROPLASTY, HIP, TOTAL, ANTERIOR APPROACH
Anesthesia: General | Site: Hip | Laterality: Right | Wound class: Clean

## 2013-05-02 MED ORDER — DIPHENHYDRAMINE HCL 12.5 MG/5ML PO ELIX: 25.0000 mg | ORAL_SOLUTION | Freq: Four times a day (QID) | ORAL | Status: DC | PRN

## 2013-05-02 MED ORDER — POLYETHYLENE GLYCOL 3350 17 G PO PACK
17.0000 g | PACK | Freq: Every day | ORAL | Status: DC | PRN
  Administered 2013-05-04: 17 g via ORAL

## 2013-05-02 MED ORDER — NEOSTIGMINE METHYLSULFATE 1 MG/ML IJ SOLN
INTRAMUSCULAR | Status: DC | PRN
  Administered 2013-05-02: 4 mg via INTRAVENOUS

## 2013-05-02 MED ORDER — PROPOFOL 10 MG/ML IV BOLUS
INTRAVENOUS | Status: DC | PRN
  Administered 2013-05-02: 200 mg via INTRAVENOUS

## 2013-05-02 MED ORDER — CEFAZOLIN SODIUM-DEXTROSE 2-3 GM-% IV SOLR
2.0000 g | INTRAVENOUS | Status: AC
  Administered 2013-05-02: 2 g via INTRAVENOUS

## 2013-05-02 MED ORDER — FERROUS SULFATE 325 (65 FE) MG PO TABS
325.0000 mg | ORAL_TABLET | Freq: Three times a day (TID) | ORAL | Status: DC
  Administered 2013-05-02 – 2013-05-04 (×5): 325 mg via ORAL
  Filled 2013-05-02 (×9): qty 1

## 2013-05-02 MED ORDER — EPHEDRINE SULFATE 50 MG/ML IJ SOLN
INTRAMUSCULAR | Status: DC | PRN
  Administered 2013-05-02 (×2): 10 mg via INTRAVENOUS

## 2013-05-02 MED ORDER — ONDANSETRON HCL 4 MG/2ML IJ SOLN
INTRAMUSCULAR | Status: DC | PRN
  Administered 2013-05-02: 4 mg via INTRAVENOUS

## 2013-05-02 MED ORDER — GLYCOPYRROLATE 0.2 MG/ML IJ SOLN
INTRAMUSCULAR | Status: DC | PRN
  Administered 2013-05-02: 0.6 mg via INTRAVENOUS

## 2013-05-02 MED ORDER — ALBUTEROL SULFATE HFA 108 (90 BASE) MCG/ACT IN AERS
2.0000 | INHALATION_SPRAY | Freq: Four times a day (QID) | RESPIRATORY_TRACT | Status: DC | PRN
  Filled 2013-05-02: qty 6.7

## 2013-05-02 MED ORDER — HYDROMORPHONE HCL PF 1 MG/ML IJ SOLN: 0.5000 mg | INTRAMUSCULAR | Status: DC | PRN

## 2013-05-02 MED ORDER — DOCUSATE SODIUM 100 MG PO CAPS
100.0000 mg | ORAL_CAPSULE | Freq: Two times a day (BID) | ORAL | Status: DC
  Administered 2013-05-02 – 2013-05-04 (×4): 100 mg via ORAL

## 2013-05-02 MED ORDER — DEXAMETHASONE SODIUM PHOSPHATE 10 MG/ML IJ SOLN
10.0000 mg | Freq: Once | INTRAMUSCULAR | Status: AC
  Administered 2013-05-02: 8 mg via INTRAVENOUS

## 2013-05-02 MED ORDER — SENNA 8.6 MG PO TABS
1.0000 | ORAL_TABLET | Freq: Two times a day (BID) | ORAL | Status: DC
  Administered 2013-05-02 – 2013-05-04 (×3): 8.6 mg via ORAL
  Filled 2013-05-02 (×3): qty 1

## 2013-05-02 MED ORDER — ONDANSETRON HCL 4 MG PO TABS: 4.0000 mg | ORAL_TABLET | Freq: Four times a day (QID) | ORAL | Status: DC | PRN

## 2013-05-02 MED ORDER — LACTATED RINGERS IV SOLN
INTRAVENOUS | Status: DC
  Administered 2013-05-02: 1000 mL via INTRAVENOUS
  Administered 2013-05-02 (×2): via INTRAVENOUS

## 2013-05-02 MED ORDER — ALUM & MAG HYDROXIDE-SIMETH 200-200-20 MG/5ML PO SUSP
30.0000 mL | ORAL | Status: DC | PRN
  Administered 2013-05-03: 30 mL via ORAL
  Filled 2013-05-02: qty 30

## 2013-05-02 MED ORDER — HYDROMORPHONE HCL PF 1 MG/ML IJ SOLN
INTRAMUSCULAR | Status: DC | PRN
  Administered 2013-05-02 (×2): 1 mg via INTRAVENOUS

## 2013-05-02 MED ORDER — FENTANYL CITRATE 0.05 MG/ML IJ SOLN
INTRAMUSCULAR | Status: DC | PRN
  Administered 2013-05-02: 100 ug via INTRAVENOUS

## 2013-05-02 MED ORDER — METHOCARBAMOL 100 MG/ML IJ SOLN
500.0000 mg | Freq: Four times a day (QID) | INTRAVENOUS | Status: DC | PRN
  Administered 2013-05-02: 500 mg via INTRAVENOUS
  Filled 2013-05-02: qty 5

## 2013-05-02 MED ORDER — ACETAMINOPHEN 10 MG/ML IV SOLN
INTRAVENOUS | Status: DC | PRN
  Administered 2013-05-02: 1000 mg via INTRAVENOUS

## 2013-05-02 MED ORDER — STERILE WATER FOR IRRIGATION IR SOLN
Status: DC | PRN
  Administered 2013-05-02 (×2): 1500 mL

## 2013-05-02 MED ORDER — MENTHOL 3 MG MT LOZG
1.0000 | LOZENGE | OROMUCOSAL | Status: DC | PRN
  Filled 2013-05-02: qty 9

## 2013-05-02 MED ORDER — ZOLPIDEM TARTRATE 5 MG PO TABS: 5.0000 mg | ORAL_TABLET | Freq: Every evening | ORAL | Status: DC | PRN

## 2013-05-02 MED ORDER — PHENOL 1.4 % MT LIQD
1.0000 | OROMUCOSAL | Status: DC | PRN
  Filled 2013-05-02: qty 177

## 2013-05-02 MED ORDER — LIDOCAINE HCL (CARDIAC) 20 MG/ML IV SOLN
INTRAVENOUS | Status: DC | PRN
  Administered 2013-05-02: 50 mg via INTRAVENOUS

## 2013-05-02 MED ORDER — DULOXETINE HCL 30 MG PO CPEP
30.0000 mg | ORAL_CAPSULE | Freq: Every day | ORAL | Status: DC
  Administered 2013-05-03 – 2013-05-04 (×2): 30 mg via ORAL
  Filled 2013-05-02 (×3): qty 1

## 2013-05-02 MED ORDER — HYDROMORPHONE HCL PF 1 MG/ML IJ SOLN
INTRAMUSCULAR | Status: AC
  Filled 2013-05-02: qty 1

## 2013-05-02 MED ORDER — METHOCARBAMOL 500 MG PO TABS
500.0000 mg | ORAL_TABLET | Freq: Four times a day (QID) | ORAL | Status: DC | PRN
  Administered 2013-05-02 – 2013-05-04 (×7): 500 mg via ORAL
  Filled 2013-05-02 (×7): qty 1

## 2013-05-02 MED ORDER — DILTIAZEM HCL ER BEADS 180 MG PO CP24
180.0000 mg | ORAL_CAPSULE | Freq: Every day | ORAL | Status: DC
  Filled 2013-05-02 (×3): qty 1

## 2013-05-02 MED ORDER — 0.9 % SODIUM CHLORIDE (POUR BTL) OPTIME
TOPICAL | Status: DC | PRN
  Administered 2013-05-02: 1000 mL

## 2013-05-02 MED ORDER — LACTATED RINGERS IV SOLN: INTRAVENOUS | Status: DC

## 2013-05-02 MED ORDER — SODIUM CHLORIDE 0.9 % IV SOLN
INTRAVENOUS | Status: DC
  Administered 2013-05-02 – 2013-05-03 (×2): via INTRAVENOUS
  Filled 2013-05-02 (×10): qty 1000

## 2013-05-02 MED ORDER — PRAVASTATIN SODIUM 40 MG PO TABS
40.0000 mg | ORAL_TABLET | Freq: Every day | ORAL | Status: DC
  Administered 2013-05-02 – 2013-05-03 (×2): 40 mg via ORAL
  Filled 2013-05-02 (×3): qty 1

## 2013-05-02 MED ORDER — HYDROCODONE-ACETAMINOPHEN 5-325 MG PO TABS
1.0000 | ORAL_TABLET | ORAL | Status: DC | PRN
  Administered 2013-05-02: 2 via ORAL
  Administered 2013-05-02 – 2013-05-03 (×3): 1 via ORAL
  Administered 2013-05-03: 2 via ORAL
  Administered 2013-05-03: 1 via ORAL
  Administered 2013-05-04: 2 via ORAL
  Administered 2013-05-04: 1 via ORAL
  Administered 2013-05-04: 2 via ORAL
  Filled 2013-05-02: qty 2
  Filled 2013-05-02 (×2): qty 1
  Filled 2013-05-02 (×2): qty 2
  Filled 2013-05-02 (×2): qty 1
  Filled 2013-05-02: qty 2
  Filled 2013-05-02 (×3): qty 1

## 2013-05-02 MED ORDER — SIMVASTATIN 20 MG PO TABS: 20.0000 mg | ORAL_TABLET | Freq: Every evening | ORAL | Status: DC

## 2013-05-02 MED ORDER — ONDANSETRON HCL 4 MG/2ML IJ SOLN: 4.0000 mg | Freq: Four times a day (QID) | INTRAMUSCULAR | Status: DC | PRN

## 2013-05-02 MED ORDER — ASPIRIN EC 325 MG PO TBEC
325.0000 mg | DELAYED_RELEASE_TABLET | Freq: Two times a day (BID) | ORAL | Status: DC
  Administered 2013-05-02 – 2013-05-04 (×4): 325 mg via ORAL
  Filled 2013-05-02 (×7): qty 1

## 2013-05-02 MED ORDER — PANTOPRAZOLE SODIUM 40 MG PO TBEC
80.0000 mg | DELAYED_RELEASE_TABLET | Freq: Every day | ORAL | Status: DC
  Filled 2013-05-02: qty 2

## 2013-05-02 MED ORDER — CEFAZOLIN SODIUM-DEXTROSE 2-3 GM-% IV SOLR
2.0000 g | Freq: Four times a day (QID) | INTRAVENOUS | Status: AC
  Administered 2013-05-02 (×2): 2 g via INTRAVENOUS
  Filled 2013-05-02 (×2): qty 50

## 2013-05-02 MED ORDER — ROCURONIUM BROMIDE 100 MG/10ML IV SOLN
INTRAVENOUS | Status: DC | PRN
  Administered 2013-05-02: 50 mg via INTRAVENOUS

## 2013-05-02 MED ORDER — HYDROMORPHONE HCL PF 1 MG/ML IJ SOLN
0.2500 mg | INTRAMUSCULAR | Status: DC | PRN
  Administered 2013-05-02 (×3): 0.5 mg via INTRAVENOUS

## 2013-05-02 MED ORDER — DEXAMETHASONE SODIUM PHOSPHATE 10 MG/ML IJ SOLN
10.0000 mg | Freq: Once | INTRAMUSCULAR | Status: DC
  Filled 2013-05-02: qty 1

## 2013-05-02 MED ORDER — MIDAZOLAM HCL 5 MG/5ML IJ SOLN
INTRAMUSCULAR | Status: DC | PRN
  Administered 2013-05-02: 2 mg via INTRAVENOUS

## 2013-05-02 MED ORDER — TRANEXAMIC ACID 100 MG/ML IV SOLN
1000.0000 mg | Freq: Once | INTRAVENOUS | Status: AC
  Administered 2013-05-02: 1000 mg via INTRAVENOUS
  Filled 2013-05-02: qty 10

## 2013-05-02 SURGICAL SUPPLY — 40 items
ADH SKN CLS APL DERMABOND .7 (GAUZE/BANDAGES/DRESSINGS) ×1
BAG SPEC THK2 15X12 ZIP CLS (MISCELLANEOUS) ×2
BAG ZIPLOCK 12X15 (MISCELLANEOUS) ×4 IMPLANT
BLADE SAW SGTL 18X1.27X75 (BLADE) ×2 IMPLANT
CLOTH BEACON ORANGE TIMEOUT ST (SAFETY) ×2 IMPLANT
DERMABOND ADVANCED (GAUZE/BANDAGES/DRESSINGS) ×1
DERMABOND ADVANCED .7 DNX12 (GAUZE/BANDAGES/DRESSINGS) ×1 IMPLANT
DRAPE C-ARM 42X72 X-RAY (DRAPES) ×2 IMPLANT
DRAPE STERI IOBAN 125X83 (DRAPES) ×2 IMPLANT
DRAPE U-SHAPE 47X51 STRL (DRAPES) ×6 IMPLANT
DRSG AQUACEL AG ADV 3.5X10 (GAUZE/BANDAGES/DRESSINGS) ×2 IMPLANT
DRSG TEGADERM 4X4.75 (GAUZE/BANDAGES/DRESSINGS) ×1 IMPLANT
DURAPREP 26ML APPLICATOR (WOUND CARE) ×2 IMPLANT
ELECT BLADE TIP CTD 4 INCH (ELECTRODE) ×2 IMPLANT
ELECT REM PT RETURN 9FT ADLT (ELECTROSURGICAL) ×2
ELECTRODE REM PT RTRN 9FT ADLT (ELECTROSURGICAL) ×1 IMPLANT
EVACUATOR 1/8 PVC DRAIN (DRAIN) ×1 IMPLANT
FACESHIELD LNG OPTICON STERILE (SAFETY) ×9 IMPLANT
GAUZE SPONGE 2X2 8PLY STRL LF (GAUZE/BANDAGES/DRESSINGS) ×1 IMPLANT
GLOVE BIOGEL PI IND STRL 7.5 (GLOVE) ×1 IMPLANT
GLOVE BIOGEL PI IND STRL 8 (GLOVE) ×1 IMPLANT
GLOVE BIOGEL PI INDICATOR 7.5 (GLOVE) ×1
GLOVE BIOGEL PI INDICATOR 8 (GLOVE) ×1
GLOVE ECLIPSE 8.0 STRL XLNG CF (GLOVE) ×2 IMPLANT
GLOVE ORTHO TXT STRL SZ7.5 (GLOVE) ×4 IMPLANT
GOWN BRE IMP PREV XXLGXLNG (GOWN DISPOSABLE) ×2 IMPLANT
GOWN STRL NON-REIN LRG LVL3 (GOWN DISPOSABLE) ×2 IMPLANT
KIT BASIN OR (CUSTOM PROCEDURE TRAY) ×2 IMPLANT
LINER NEUTRAL 52X36MM PLUS 4 (Liner) ×1 IMPLANT
PACK TOTAL JOINT (CUSTOM PROCEDURE TRAY) ×2 IMPLANT
PADDING CAST COTTON 6X4 STRL (CAST SUPPLIES) ×2 IMPLANT
SPONGE GAUZE 2X2 STER 10/PKG (GAUZE/BANDAGES/DRESSINGS) ×1
SUCTION FRAZIER 12FR DISP (SUCTIONS) ×2 IMPLANT
SUT MNCRL AB 4-0 PS2 18 (SUTURE) ×2 IMPLANT
SUT VIC AB 1 CT1 36 (SUTURE) ×8 IMPLANT
SUT VIC AB 2-0 CT1 27 (SUTURE) ×4
SUT VIC AB 2-0 CT1 TAPERPNT 27 (SUTURE) ×2 IMPLANT
SUT VLOC 180 0 24IN GS25 (SUTURE) ×2 IMPLANT
TOWEL OR 17X26 10 PK STRL BLUE (TOWEL DISPOSABLE) ×4 IMPLANT
TRAY FOLEY CATH 14FRSI W/METER (CATHETERS) ×2 IMPLANT

## 2013-05-02 NOTE — Op Note (Signed)
NAME:  [name].: [id number]      MEDICAL RECORD NO.: [id number]      FACILITY:  [facility]      PHYSICIAN:  Durene Romans D  DATE OF BIRTH:  27-Mar-1951     DATE OF PROCEDURE:  05/02/2013                                 OPERATIVE REPORT         PREOPERATIVE DIAGNOSIS: Right  hip osteoarthritis.      POSTOPERATIVE DIAGNOSIS:  Right hip osteoarthritis.      PROCEDURE:  Right total hip replacement through an anterior approach   utilizing DePuy THR system, component size 52mm pinnacle cup, a size 36+4 neutral   Altrex liner, a size 4 Hi Tri Lock stem with a 36+5 delta ceramic   ball.      SURGEON:  Madlyn Frankel. Charlann Boxer, M.D.      ASSISTANT:  Leilani Able, PA-C      ANESTHESIA:  GET.      SPECIMENS:  None.      COMPLICATIONS:  None.      BLOOD LOSS:  700 cc     DRAINS:  One Hemovac.      INDICATION OF THE PROCEDURE:  [name] is a 62 y.o. female who had   presented to office for evaluation of right hip pain.  Radiographs revealed   progressive degenerative changes with bone-on-bone   articulation to the  hip joint.  The patient had painful limited range of   motion significantly affecting their overall quality of life.  The patient was failing to    respond to conservative measures, and at this point was ready   to proceed with more definitive measures.  The patient has noted progressive   degenerative changes in his hip, progressive problems and dysfunction   with regarding the hip prior to surgery.  Consent was obtained for   benefit of pain relief.  Specific risk of infection, DVT, component   failure, dislocation, need for revision surgery, as well discussion of   the anterior versus posterior approach were reviewed.  Consent was   obtained for benefit of anterior pain relief through an anterior   approach.      PROCEDURE IN DETAIL:  The patient was brought to operative theater.   Once adequate anesthesia,  preoperative antibiotics, 2gm Ancef administered.   The patient was positioned supine on the OSI Hanna table.  Once adequate   padding of boney process was carried out, we had predraped out the hip, and  used fluoroscopy to confirm orientation of the pelvis and position.      The right hip was then prepped and draped from proximal iliac crest to   mid thigh with shower curtain technique.      Time-out was performed identifying the patient, planned procedure, and   extremity.     An incision was then made 2 cm distal and lateral to the   anterior superior iliac spine extending over the orientation of the   tensor fascia lata muscle and sharp dissection was carried down to the   fascia of the muscle and protractor placed in the soft tissues.      The fascia was then incised.  The muscle belly was identified and  swept   laterally and retractor placed along the superior neck.  Following   cauterization of the circumflex vessels and removing some pericapsular   fat, a second cobra retractor was placed on the inferior neck.  A third   retractor was placed on the anterior acetabulum after elevating the   anterior rectus.  A L-capsulotomy was along the line of the   superior neck to the trochanteric fossa, then extended proximally and   distally.  Tag sutures were placed and the retractors were then placed   intracapsular.  We then identified the trochanteric fossa and   orientation of my neck cut, confirmed this radiographically   and then made a neck osteotomy with the femur on traction.  The femoral   head was removed without difficulty or complication.  Traction was let   off and retractors were placed posterior and anterior around the   acetabulum.      The labrum and foveal tissue were debrided.  I began reaming with a 45mm   reamer and reamed up to 51mm reamer with good bony bed preparation and a 52   cup was chosen.  The final 52mm Pinnacle cup was then impacted under fluoroscopy  to  confirm the depth of penetration and orientation with respect to   abduction.  A screw was placed followed by the hole eliminator.  The final   36+4 neutral Altrex liner was impacted with good visualized rim fit.  The cup was positioned anatomically within the acetabular portion of the pelvis.      At this point, the femur was rolled at 80 degrees.  Further capsule was   released off the inferior aspect of the femoral neck.  I then   released the superior capsule proximally.  The hook was placed laterally   along the femur and elevated manually and held in position with the bed   hook.  The leg was then extended and adducted with the leg rolled to 100   degrees of external rotation.  Once the proximal femur was fully   exposed, I used a box osteotome to set orientation.  I then began   broaching with the starting chili pepper broach and passed this by hand and then broached up to 4.  With the 4 broach in place I chose a high offset neck and did a trial reduction first with a 36+1.5 then the +5 trial ball.  The offset was appropriate, leg lengths   appeared to be equal, confirmed radiographically.   Given these findings, I went ahead and dislocated the hip, repositioned all   retractors and positioned the right hip in the extended and abducted position.  The final 4 Hi Tri Lock stem was   chosen and it was impacted down to the level of neck cut.  Based on this   and the trial reduction, a 36+5 delta ceramic ball was chosen and   impacted onto a clean and dry trunnion, and the hip was reduced.  The   hip had been irrigated throughout the case again at this point.  I did   reapproximate the superior capsular leaflet to the anterior leaflet   using #1 Vicryl, placed a medium Hemovac drain deep.  The fascia of the   tensor fascia lata muscle was then reapproximated using #1 Vicryl.  The   remaining wound was closed with 2-0 Vicryl and running 4-0 Monocryl.   The hip was cleaned, dried, and dressed  sterilely using Dermabond and  Aquacel dressing.  Drain site dressed separately.  She was then brought   to recovery room in stable condition tolerating the procedure well.    Leilani Able, PA-C was present for the entirety of the case involved from   preoperative positioning, perioperative retractor management, general   facilitation of the case, as well as primary wound closure as assistant.            Madlyn Frankel Charlann Boxer, M.D.            MDO/MEDQ  D:  09/29/2011  T:  09/29/2011  Job:  [id number]      Electronically Signed by Durene Romans M.D. on 10/05/2011 09:15:38 AM

## 2013-05-02 NOTE — Plan of Care (Signed)
Problem: Consults Goal: Diagnosis- Total Joint Replacement Right anterior hip     

## 2013-05-02 NOTE — Progress Notes (Signed)
Utilization review completed.  

## 2013-05-02 NOTE — Anesthesia Preprocedure Evaluation (Signed)
Anesthesia Evaluation  Patient identified by MRN, date of birth, ID band Patient awake    Reviewed: Allergy & Precautions, H&P , NPO status , Patient's Chart, lab work & pertinent test results, reviewed documented beta blocker date and time   Airway Mallampati: II TM Distance: >3 FB Neck ROM: full    Dental  (+) Caps and Dental Advisory Given All front upper teeth are capped:   Pulmonary neg pulmonary ROS,  breath sounds clear to auscultation  Pulmonary exam normal       Cardiovascular hypertension, Pt. on medications Rhythm:regular Rate:Normal  RBBB   Neuro/Psych negative neurological ROS  negative psych ROS   GI/Hepatic negative GI ROS, Neg liver ROS, GERD-  Medicated and Controlled,  Endo/Other  negative endocrine ROS  Renal/GU negative Renal ROS  negative genitourinary   Musculoskeletal   Abdominal   Peds  Hematology negative hematology ROS (+)   Anesthesia Other Findings   Reproductive/Obstetrics negative OB ROS                           Anesthesia Physical Anesthesia Plan  ASA: II  Anesthesia Plan: General   Post-op Pain Management:    Induction:   Airway Management Planned: Oral ETT  Additional Equipment:   Intra-op Plan:   Post-operative Plan: Extubation in OR  Informed Consent: I have reviewed the patients History and Physical, chart, labs and discussed the procedure including the risks, benefits and alternatives for the proposed anesthesia with the patient or authorized representative who has indicated his/her understanding and acceptance.   Dental Advisory Given  Plan Discussed with: CRNA and Surgeon  Anesthesia Plan Comments:         Anesthesia Quick Evaluation

## 2013-05-02 NOTE — Progress Notes (Signed)
PT Cancellation Note  Patient Details Name: [name] MRN: [id number] DOB: 1951-03-23   Cancelled Treatment:     Attempted PT eval POD 0-pt declined to participate at this time-"too groggy". Pt states she wants to wait until later tonight to try mobilizing. PT will likely check back in am for eval. Pt may benefit from nursing dangling edge of bed later this evening. Thanks.   Rebeca Alert, MPT Pager: [phone]

## 2013-05-02 NOTE — Interval H&P Note (Signed)
History and Physical Interval Note:  05/02/2013 9:43 AM  [name]  has presented today for surgery, with the diagnosis of Right Hip Osteoarthritis  The various methods of treatment have been discussed with the patient and family. After consideration of risks, benefits and other options for treatment, the patient has consented to  Procedure(s): RIGHT TOTAL HIP ARTHROPLASTY ANTERIOR APPROACH (Right) as a surgical intervention .  The patient's history has been reviewed, patient examined, no change in status, stable for surgery.  I have reviewed the patient's chart and labs.  Questions were answered to the patient's satisfaction.     [name]

## 2013-05-02 NOTE — Anesthesia Postprocedure Evaluation (Signed)
  Anesthesia Post-op Note  Patient: [name]  Procedure(s) Performed: Procedure(s) (LRB): RIGHT TOTAL HIP ARTHROPLASTY ANTERIOR APPROACH (Right)  Patient Location: PACU  Anesthesia Type: General  Level of Consciousness: awake and alert   Airway and Oxygen Therapy: Patient Spontanous Breathing  Post-op Pain: mild  Post-op Assessment: Post-op Vital signs reviewed, Patient's Cardiovascular Status Stable, Respiratory Function Stable, Patent Airway and No signs of Nausea or vomiting  Last Vitals:  Filed Vitals:   05/02/13 1230  BP:   Pulse: 67  Temp:   Resp: 13    Post-op Vital Signs: stable   Complications: No apparent anesthesia complications

## 2013-05-02 NOTE — Transfer of Care (Signed)
Immediate Anesthesia Transfer of Care Note  Patient: [name]  Procedure(s) Performed: Procedure(s): RIGHT TOTAL HIP ARTHROPLASTY ANTERIOR APPROACH (Right)  Patient Location: PACU  Anesthesia Type:General  Level of Consciousness: awake, alert  and oriented  Airway & Oxygen Therapy: Patient Spontanous Breathing and Patient connected to face mask oxygen  Post-op Assessment: Report given to PACU RN and Post -op Vital signs reviewed and stable  Post vital signs: Reviewed and stable  Complications: No apparent anesthesia complications

## 2013-05-03 ENCOUNTER — Encounter (HOSPITAL_COMMUNITY): Payer: Self-pay | Admitting: Orthopedic Surgery

## 2013-05-03 DIAGNOSIS — Z96649 Presence of unspecified artificial hip joint: Secondary | ICD-10-CM

## 2013-05-03 DIAGNOSIS — D500 Iron deficiency anemia secondary to blood loss (chronic): Secondary | ICD-10-CM | POA: Diagnosis not present

## 2013-05-03 DIAGNOSIS — E669 Obesity, unspecified: Secondary | ICD-10-CM | POA: Diagnosis present

## 2013-05-03 LAB — CBC
HCT: 30.8 % — ABNORMAL LOW (ref 36.0–46.0)
MCV: 84.6 fL (ref 78.0–100.0)
RBC: 3.64 MIL/uL — ABNORMAL LOW (ref 3.87–5.11)
WBC: 14.4 10*3/uL — ABNORMAL HIGH (ref 4.0–10.5)

## 2013-05-03 LAB — BASIC METABOLIC PANEL
BUN: 8 mg/dL (ref 6–23)
CO2: 26 mEq/L (ref 19–32)
Chloride: 107 mEq/L (ref 96–112)
Creatinine, Ser: 0.54 mg/dL (ref 0.50–1.10)
Glucose, Bld: 148 mg/dL — ABNORMAL HIGH (ref 70–99)

## 2013-05-03 MED ORDER — FERROUS SULFATE 325 (65 FE) MG PO TABS: 325.0000 mg | ORAL_TABLET | Freq: Three times a day (TID) | ORAL | Status: DC

## 2013-05-03 MED ORDER — TIZANIDINE HCL 4 MG PO CAPS: 4.0000 mg | ORAL_CAPSULE | Freq: Three times a day (TID) | ORAL | Status: DC

## 2013-05-03 MED ORDER — POLYETHYLENE GLYCOL 3350 17 G PO PACK: 17.0000 g | PACK | Freq: Every day | ORAL | Status: DC | PRN

## 2013-05-03 MED ORDER — HYDROCODONE-ACETAMINOPHEN 5-325 MG PO TABS: 1.0000 | ORAL_TABLET | ORAL | Status: DC | PRN

## 2013-05-03 MED ORDER — OMEPRAZOLE 20 MG PO CPDR
40.0000 mg | DELAYED_RELEASE_CAPSULE | Freq: Every day | ORAL | Status: DC
  Administered 2013-05-03 – 2013-05-04 (×2): 40 mg via ORAL
  Filled 2013-05-03 (×2): qty 2

## 2013-05-03 MED ORDER — ASPIRIN 325 MG PO TBEC: 325.0000 mg | DELAYED_RELEASE_TABLET | Freq: Two times a day (BID) | ORAL | Status: DC

## 2013-05-03 MED ORDER — DSS 100 MG PO CAPS: 100.0000 mg | ORAL_CAPSULE | Freq: Two times a day (BID) | ORAL | Status: DC

## 2013-05-03 MED ORDER — NON FORMULARY: 40.0000 mg | Freq: Every day | Status: DC

## 2013-05-03 MED ORDER — DILTIAZEM HCL ER COATED BEADS 180 MG PO CP24
180.0000 mg | ORAL_CAPSULE | Freq: Every day | ORAL | Status: DC
  Administered 2013-05-03 – 2013-05-04 (×2): 180 mg via ORAL
  Filled 2013-05-03 (×3): qty 1

## 2013-05-03 NOTE — Progress Notes (Signed)
Occupational Therapy Evaluation Patient Details Name: [name] MRN: [id number] DOB: 1951-01-14 Today's Date: 05/03/2013 Time: 5784-6962 OT Time Calculation (min): 34 min  OT Assessment / Plan / Recommendation Clinical Impression  62 yo s/p R ant THA. PTA, pt independent with ADL and moiblity. Pt limited today by pain and apparent spasms. Do not feel pt is ready to D/C home today and will benefit from staying in hospital tonight and D/C tomorrow. Completed all education regarding compensatory techniques for ADL and functional moiblity for ADL. Written information given. Husband present for session. No further OT indicated.    OT Assessment  Patient does not need any further OT services    Follow Up Recommendations  No OT follow up    Barriers to Discharge      Equipment Recommendations  Other (comment) ([name])    Recommendations for Other Services    Frequency       Precautions / Restrictions Precautions Precautions: Fall Restrictions Weight Bearing Restrictions: No Other Position/Activity Restrictions: WBAT   Pertinent Vitals/Pain 6. R hip. Worse with movement. Premedicated. Ice applied    ADL  Grooming: Supervision/safety;Performed Where Assessed - Grooming: Unsupported standing Upper Body Bathing: Supervision/safety;Set up Where Assessed - Upper Body Bathing: Unsupported sitting Lower Body Bathing: Minimal assistance Where Assessed - Lower Body Bathing: Unsupported sit to stand Upper Body Dressing: Supervision/safety;Set up Where Assessed - Upper Body Dressing: Unsupported sitting Lower Body Dressing: Moderate assistance Where Assessed - Lower Body Dressing: Unsupported sit to stand Toilet Transfer: Min guard Toilet Transfer Method: Sit to stand;Other (comment) (ambulating) Toilet Transfer Equipment: Bedside commode;Other (comment) (over toilet) Toileting - Clothing Manipulation and Hygiene: Modified independent Where Assessed -  Toileting Clothing Manipulation and Hygiene: Sit to stand from 3-in-1 or toilet Tub/Shower Transfer:  (Educated pt/husband on technique) Equipment Used: Gait belt;Rolling walker;Long-handled sponge;Reacher;Sock aid Transfers/Ambulation Related to ADLs: Minguard ADL Comments: Educated pt/husband on compensatory techniques for ADL and available AE. Educatedon use of DME. Rec that pt use BSC initiallty for toileting and in the shower. Pt can use her sister's.    OT Diagnosis:    OT Problem List:   OT Treatment Interventions:     OT Goals Acute Rehab OT Goals OT Goal Formulation:  (eval only)  Visit Information  Last OT Received On: 05/03/13 Assistance Needed: +1    Subjective Data      Prior Functioning     Home Living Lives With: Spouse Available Help at Discharge: Family Type of Home: House Home Access: Stairs to enter Secretary/administrator of Steps: 1 Entrance Stairs-Rails: None Home Layout: One level Bathroom Shower/Tub: Tub/shower unit;Walk-in shower Bathroom Toilet: Handicapped height Bathroom Accessibility: Yes How Accessible: Accessible via walker Home Adaptive Equipment: Walker - rolling Prior Function Level of Independence: Independent Able to Take Stairs?: Yes Driving: Yes Vocation: Full time employment Communication Communication: No difficulties Dominant Hand: Right         Vision/Perception Vision - History Baseline Vision: Wears glasses only for reading   Cognition  Cognition Arousal/Alertness: Awake/alert Behavior During Therapy: WFL for tasks assessed/performed Overall Cognitive Status: Within Functional Limits for tasks assessed    Extremity/Trunk Assessment Right Upper Extremity Assessment RUE ROM/Strength/Tone: [facility] for tasks assessed Left Upper Extremity Assessment LUE ROM/Strength/Tone: WFL for tasks assessed Right Lower Extremity Assessment RLE ROM/Strength/Tone: Deficits RLE ROM/Strength/Tone Deficits: 2/5 hip strnegth with AAROM  at hip to 50 flex and 15 abd Left Lower Extremity Assessment LLE ROM/Strength/Tone: [facility] for tasks assessed Trunk Assessment  Trunk Assessment: Normal     Mobility Transfers Transfers: Sit to Stand;Stand to Sit Sit to Stand: 4: Min guard Stand to Sit: 4: Min guard Details for Transfer Assistance: moves slowly (cues for sequence for ambulation to decrease pain)     Exercise  B ankle pumps prior to ambulation   Balance  S RW level   End of Session OT - End of Session Equipment Utilized During Treatment: Gait belt Activity Tolerance: Patient limited by pain (appears to be having muscle spasms) Patient left: in chair;with call bell/phone within reach;with family/visitor present Nurse Communication: Mobility status  GO     Rodgerick Gilliand,HILLARY 05/03/2013, 2:48 PM [facility], OTR/L  [phone] 05/03/2013

## 2013-05-03 NOTE — Progress Notes (Signed)
Physical Therapy Treatment Patient Details Name: [name] MRN: [id number] DOB: 07-11-1951 Today's Date: 05/03/2013 Time: 7829-5621 PT Time Calculation (min): 23 min  PT Assessment / Plan / Recommendation Comments on Treatment Session       Follow Up Recommendations  Home health PT     Does the patient have the potential to tolerate intense rehabilitation     Barriers to Discharge        Equipment Recommendations  None recommended by PT    Recommendations for Other Services OT consult  Frequency 7X/week   Plan Discharge plan remains appropriate    Precautions / Restrictions Precautions Precautions: Fall Restrictions Weight Bearing Restrictions: No Other Position/Activity Restrictions: WBAT   Pertinent Vitals/Pain 8/10; ice packs provided, RN aware and providing MEDS when available    Mobility  Bed Mobility Bed Mobility: Sit to Supine Sit to Supine: 4: Min assist;3: Mod assist Details for Bed Mobility Assistance: cues for sequence and use of L LE to self assist Transfers Transfers: Sit to Stand;Stand to Sit Sit to Stand: 4: Min assist Stand to Sit: 4: Min guard Details for Transfer Assistance: moves slowly Ambulation/Gait Ambulation/Gait Assistance: 4: Min assist Ambulation Distance (Feet): 60 Feet Assistive device: Rolling walker Ambulation/Gait Assistance Details: cues for sequence, posture, stride length, position from RW and increased DF  to advance foot Gait Pattern: Step-to pattern;Decreased step length - right;Decreased step length - left;Decreased stance time - right Stairs: No    Exercises     PT Diagnosis:    PT Problem List:   PT Treatment Interventions:     PT Goals Acute Rehab PT Goals PT Goal Formulation: With patient Time For Goal Achievement: 05/09/13 Potential to Achieve Goals: Good Pt will go Supine/Side to Sit: with supervision PT Goal: Supine/Side to Sit - Progress: Goal set today Pt will go Sit to Supine/Side: with  supervision PT Goal: Sit to Supine/Side - Progress: Goal set today Pt will go Sit to Stand: with supervision PT Goal: Sit to Stand - Progress: Goal set today Pt will go Stand to Sit: with supervision PT Goal: Stand to Sit - Progress: Goal set today Pt will Ambulate: 51 - 150 feet;with supervision;with rolling walker PT Goal: Ambulate - Progress: Goal set today Pt will Go Up / Down Stairs: 1-2 stairs;with min assist;with least restrictive assistive device PT Goal: Up/Down Stairs - Progress: Goal set today  Visit Information  Last PT Received On: 05/10/13 Assistance Needed: +1    Subjective Data  Subjective: I am doing better than this morning but still have trouble just moving my foot Patient Stated Goal: Resume previous lifestyle with decreased pain   Cognition  Cognition Arousal/Alertness: Awake/alert Behavior During Therapy: WFL for tasks assessed/performed Overall Cognitive Status: Within Functional Limits for tasks assessed    Balance     End of Session PT - End of Session Equipment Utilized During Treatment: Gait belt Activity Tolerance: Patient tolerated treatment well Patient left: in bed;with call bell/phone within reach Nurse Communication: Mobility status   GP     Keyton Bhat 05/03/2013, 4:08 PM

## 2013-05-03 NOTE — Care Management Note (Unsigned)
    Page 1 of 1   05/03/2013     12:40:17 PM   CARE MANAGEMENT NOTE 05/03/2013  Patient:  [name], [name]   Account Number:  [id number]  Date Initiated:  05/03/2013  Documentation initiated by:  Colleen Can  Subjective/Objective Assessment:   dx rt hip osdteoarthritis; total hip replacemnt- anterior approach     Action/Plan:   CM spoke with patient and spouse. Plans are for patient to return to her home in [place] where spouse will be caregiver. She already has RW and BR is equipped with high tiolet.   Anticipated DC Date:  05/04/2013   Anticipated DC Plan:  HOME W HOME HEALTH SERVICES      DC Planning Services  CM consult      [facility] Choice  HOME HEALTH   Choice offered to / List presented to:  C-1 Patient        HH arranged  HH-2 PT      [facility] agency  [facility]   Status of service:  In process, will continue to follow Medicare Important Message given?   (If response is "NO", the following Medicare IM given date fields will be blank) Date Medicare IM given:   Date Additional Medicare IM given:    Discharge Disposition:    Per UR Regulation:    If discussed at Long Length of Stay Meetings, dates discussed:    Comments:  05/03/2013 Colleen Can BSN RN CCM [phone] TCT [facility] who advised that they would be able to provide HHpt services upon patient's discharge. services will start day after patient's discharge.

## 2013-05-03 NOTE — Evaluation (Signed)
Physical Therapy Evaluation Patient Details Name: [name] MRN: [id number] DOB: 03/02/1951 Today's Date: 05/03/2013 Time: 4098-1191 PT Time Calculation (min): 30 min  PT Assessment / Plan / Recommendation Clinical Impression  Pt s/p R THR presents with decreased R LE strength/ROM and post op pain limiting functional mobility    PT Assessment  Patient needs continued PT services    Follow Up Recommendations  Home health PT    Does the patient have the potential to tolerate intense rehabilitation      Barriers to Discharge None      Equipment Recommendations  None recommended by PT    Recommendations for Other Services OT consult   Frequency 7X/week    Precautions / Restrictions Precautions Precautions: Fall Restrictions Weight Bearing Restrictions: No Other Position/Activity Restrictions: WBAT   Pertinent Vitals/Pain 7-8/10 with activity  - "it hurts so bad its making me nauseous"; premed, ice packs provided, RN providing MR      Mobility  Bed Mobility Bed Mobility: Supine to Sit Supine to Sit: 4: Min assist;3: Mod assist Details for Bed Mobility Assistance: cues for sequence and use of L LE to self assist Transfers Transfers: Sit to Stand;Stand to Sit Sit to Stand: 4: Min assist;3: Mod assist Stand to Sit: 4: Min assist;3: Mod assist Details for Transfer Assistance: cues for LE management and use of UEs to self assist Ambulation/Gait Ambulation/Gait Assistance: 4: Min assist;3: Mod assist Ambulation Distance (Feet): 5 Feet Assistive device: Rolling walker Ambulation/Gait Assistance Details: cues for sequence, posture and position from RW Gait Pattern: Step-to pattern;Decreased step length - right;Decreased step length - left;Decreased stance time - right Stairs: No    Exercises Total Joint Exercises Ankle Circles/Pumps: AROM;15 reps;Both;Supine Quad Sets: AROM;Both;10 reps;Supine Heel Slides: AAROM;5 reps;Supine;Right Hip ABduction/ADduction:  AAROM;Supine;15 reps;Right   PT Diagnosis:    PT Problem List: Decreased strength;Decreased range of motion;Decreased activity tolerance;Decreased mobility;Decreased knowledge of use of DME;Pain PT Treatment Interventions: DME instruction;Gait training;Stair training;Functional mobility training;Therapeutic activities;Therapeutic exercise;Patient/family education   PT Goals Acute Rehab PT Goals PT Goal Formulation: With patient Time For Goal Achievement: 05/09/13 Potential to Achieve Goals: Good Pt will go Supine/Side to Sit: with supervision PT Goal: Supine/Side to Sit - Progress: Goal set today Pt will go Sit to Supine/Side: with supervision PT Goal: Sit to Supine/Side - Progress: Goal set today Pt will go Sit to Stand: with supervision PT Goal: Sit to Stand - Progress: Goal set today Pt will go Stand to Sit: with supervision PT Goal: Stand to Sit - Progress: Goal set today Pt will Ambulate: 51 - 150 feet;with supervision;with rolling walker PT Goal: Ambulate - Progress: Goal set today Pt will Go Up / Down Stairs: 1-2 stairs;with min assist;with least restrictive assistive device PT Goal: Up/Down Stairs - Progress: Goal set today  Visit Information  Last PT Received On: 05/03/13 Assistance Needed: +2    Subjective Data  Subjective: I used to move the rest of my body instead of my hip Patient Stated Goal: Resume previous lifestyle with decreased pain   Prior Functioning  Home Living Lives With: Spouse Available Help at Discharge: Family Type of Home: House Home Access: Stairs to enter Secretary/administrator of Steps: 1 Entrance Stairs-Rails: None Home Layout: One level Home Adaptive Equipment: Walker - rolling Prior Function Level of Independence: Independent Able to Take Stairs?: Yes Driving: Yes Vocation: Full time employment Communication Communication: No difficulties Dominant Hand: Right    Cognition  Cognition Arousal/Alertness: Awake/alert Behavior During  Therapy: [facility] for  tasks assessed/performed Overall Cognitive Status: Within Functional Limits for tasks assessed    Extremity/Trunk Assessment Right Upper Extremity Assessment RUE ROM/Strength/Tone: [facility] for tasks assessed Left Upper Extremity Assessment LUE ROM/Strength/Tone: WFL for tasks assessed Right Lower Extremity Assessment RLE ROM/Strength/Tone: Deficits RLE ROM/Strength/Tone Deficits: 2/5 hip strnegth with AAROM at hip to 50 flex and 15 abd Left Lower Extremity Assessment LLE ROM/Strength/Tone: WFL for tasks assessed Trunk Assessment Trunk Assessment: Normal   Balance    End of Session PT - End of Session Equipment Utilized During Treatment: Gait belt Activity Tolerance: Patient tolerated treatment well Patient left: in chair;with call bell/phone within reach;with family/visitor present Nurse Communication: Mobility status  GP     [name] 05/03/2013, 1:23 PM

## 2013-05-03 NOTE — Progress Notes (Signed)
   Subjective: 1 Day Post-Op Procedure(s) (LRB): RIGHT TOTAL HIP ARTHROPLASTY ANTERIOR APPROACH (Right)   Patient reports pain as mild, pain well controlled. No events throughout the night. Ready to be discharged home if she does well with PT and pain stays well controlled.   Objective:   VITALS:   Filed Vitals:   05/03/13 0637  BP: 111/69  Pulse: 81  Temp: 98.7 F (37.1 C)  Resp: 14    Neurovascular intact Dorsiflexion/Plantar flexion intact Incision: dressing C/D/I No cellulitis present Compartment soft  LABS  Recent Labs  05/03/13 0443  HGB 9.9*  HCT 30.8*  WBC 14.4*  PLT 276     Recent Labs  05/03/13 0443  NA 141  K 3.8  BUN 8  CREATININE 0.54  GLUCOSE 148*     Assessment/Plan: 1 Day Post-Op Procedure(s) (LRB): RIGHT TOTAL HIP ARTHROPLASTY ANTERIOR APPROACH (Right) HV drain d/c'ed Foley cath d/c'ed Having some GERD sxs, changed from protonix to omeprazole. Protonix hasn't ever worked for her. Advance diet Up with therapy D/C IV fluids Discharge home with home health Follow up in 2 weeks at [facility]. Follow up with OLIN,Emmerson Taddei D in 2 weeks.  Contact information:  [facility] [address], [address] [place] [address] [phone]    Expected ABLA  Treated with iron and will observe  Obese (BMI 30-39.9) Estimated body mass index is 33.95 kg/(m^2) as calculated from the following:   Height as of this encounter: 5\' 5"  (1.651 m).   Weight as of this encounter: 92.534 kg (204 lb). Patient also counseled that weight may inhibit the healing process Patient counseled that losing weight will help with future health issues        Anastasio Auerbach. Greenlee Ancheta   PAC  05/03/2013, 9:07 AM

## 2013-05-03 NOTE — Progress Notes (Signed)
CSW consulted for SNF placement. PN reviewed. PT recommends HHPT following hospital d/c. RNCM will assist with d/c planning. CSW is available to assist if plan changes and SNF placement is needed.  Cori Razor LCSW [phone]

## 2013-05-04 LAB — CBC
Hemoglobin: 9.1 g/dL — ABNORMAL LOW (ref 12.0–15.0)
RBC: 3.35 MIL/uL — ABNORMAL LOW (ref 3.87–5.11)
WBC: 16.1 10*3/uL — ABNORMAL HIGH (ref 4.0–10.5)

## 2013-05-04 LAB — BASIC METABOLIC PANEL
BUN: 15 mg/dL (ref 6–23)
CO2: 29 mEq/L (ref 19–32)
Calcium: 8.9 mg/dL (ref 8.4–10.5)
Chloride: 105 mEq/L (ref 96–112)
Creatinine, Ser: 0.61 mg/dL (ref 0.50–1.10)
GFR calc Af Amer: >90 mL/min (ref >90)
GFR calc non Af Amer: >90 mL/min (ref >90)
Glucose, Bld: 123 mg/dL — ABNORMAL HIGH (ref 70–99)
Potassium: 3.5 mEq/L (ref 3.5–5.1)
Sodium: 141 mEq/L (ref 135–145)

## 2013-05-04 NOTE — Progress Notes (Signed)
Physical Therapy Treatment Patient Details Name: [name] MRN: [id number] DOB: 1951-07-07 Today's Date: 05/04/2013 Time: 8657-8469 PT Time Calculation (min): 33 min  PT Assessment / Plan / Recommendation Comments on Treatment Session       Follow Up Recommendations  Home health PT     Does the patient have the potential to tolerate intense rehabilitation     Barriers to Discharge        Equipment Recommendations  None recommended by PT    Recommendations for Other Services OT consult  Frequency 7X/week   Plan Discharge plan remains appropriate    Precautions / Restrictions Precautions Precautions: Fall Restrictions Weight Bearing Restrictions: No Other Position/Activity Restrictions: WBAT   Pertinent Vitals/Pain 4/10; premed, ice packs provided    Mobility  Bed Mobility Bed Mobility: Supine to Sit Supine to Sit: 4: Min assist Details for Bed Mobility Assistance: cues for sequence and use of L LE to self assist Transfers Transfers: Sit to Stand;Stand to Sit Sit to Stand: 4: Min guard Stand to Sit: 4: Min guard Details for Transfer Assistance: min cues for LE management and use of UEs Ambulation/Gait Ambulation/Gait Assistance: 4: Min guard Ambulation Distance (Feet): 111 Feet Assistive device: Rolling walker Ambulation/Gait Assistance Details: cues for posture, position from RW, stride length, sequence and increased R DF Gait Pattern: Step-to pattern;Decreased step length - right;Decreased step length - left;Decreased stance time - right Stairs: No    Exercises Total Joint Exercises Ankle Circles/Pumps: AROM;15 reps;Both;Supine Quad Sets: AROM;Both;Supine;15 reps Gluteal Sets: AROM;Both;10 reps;Supine Heel Slides: AAROM;Supine;Right;20 reps Hip ABduction/ADduction: AAROM;Supine;15 reps;Right   PT Diagnosis:    PT Problem List:   PT Treatment Interventions:     PT Goals Acute Rehab PT Goals PT Goal Formulation: With patient Time For Goal  Achievement: 05/09/13 Potential to Achieve Goals: Good Pt will go Supine/Side to Sit: with supervision PT Goal: Supine/Side to Sit - Progress: Progressing toward goal Pt will go Sit to Supine/Side: with supervision PT Goal: Sit to Supine/Side - Progress: Progressing toward goal Pt will go Sit to Stand: with supervision PT Goal: Sit to Stand - Progress: Progressing toward goal Pt will go Stand to Sit: with supervision PT Goal: Stand to Sit - Progress: Progressing toward goal Pt will Ambulate: 51 - 150 feet;with supervision;with rolling walker PT Goal: Ambulate - Progress: Progressing toward goal Pt will Go Up / Down Stairs: 1-2 stairs;with min assist;with least restrictive assistive device PT Goal: Up/Down Stairs - Progress: Progressing toward goal  Visit Information  Last PT Received On: 05/04/13 Assistance Needed: +1    Subjective Data  Subjective: I'm doing much better than yesterday but a little shaky after walking Patient Stated Goal: Resume previous lifestyle with decreased pain   Cognition  Cognition Arousal/Alertness: Awake/alert Behavior During Therapy: WFL for tasks assessed/performed Overall Cognitive Status: Within Functional Limits for tasks assessed    Balance     End of Session PT - End of Session Equipment Utilized During Treatment: Gait belt Activity Tolerance: Patient tolerated treatment well Patient left: in chair;with call bell/phone within reach;with family/visitor present Nurse Communication: Mobility status   GP     [name] 05/04/2013, 12:46 PM

## 2013-05-04 NOTE — Progress Notes (Signed)
   Subjective: 2 Days Post-Op Procedure(s) (LRB): RIGHT TOTAL HIP ARTHROPLASTY ANTERIOR APPROACH (Right)   Patient reports pain as mild, pain well controlled. No events throughout the night. Feels better today and ready to be discharged home.  Objective:   VITALS:   Filed Vitals:   05/04/13 0606  BP: 118/68  Pulse: 58  Temp: 97.8 F (36.6 C)  Resp: 14    Neurovascular intact Dorsiflexion/Plantar flexion intact Incision: dressing C/D/I No cellulitis present Compartment soft  LABS  Recent Labs  05/03/13 0443 05/04/13 0405  HGB 9.9* 9.1*  HCT 30.8* 28.6*  WBC 14.4* 16.1*  PLT 276 251     Recent Labs  05/03/13 0443 05/04/13 0405  NA 141 141  K 3.8 3.5  BUN 8 15  CREATININE 0.54 0.61  GLUCOSE 148* 123*     Assessment/Plan: 2 Days Post-Op Procedure(s) (LRB): RIGHT TOTAL HIP ARTHROPLASTY ANTERIOR APPROACH (Right) Up with therapy Discharge home with home health Follow up in 2 weeks at [facility] [place] [facility]. Follow up with OLIN,Bernese Doffing D in 2 weeks.  Contact information:  [facility] [address], [address] [place] [address] [phone]    Expected ABLA  Treated with iron and will observe   Obese (BMI 30-39.9)  Estimated body mass index is 33.95 kg/(m^2) as calculated from the following:      Height as of this encounter: 5\' 5"  (1.651 m).      Weight as of this encounter: 92.534 kg (204 lb).  Patient also counseled that weight may inhibit the healing process  Patient counseled that losing weight will help with future health issues        Anastasio Auerbach. Nathyn Luiz   PAC  05/04/2013, 8:15 AM

## 2013-05-04 NOTE — Progress Notes (Signed)
Physical Therapy Treatment Patient Details Name: [name] MRN: [id number] DOB: July 19, 1951 Today's Date: 05/04/2013 Time: 1345-1405 PT Time Calculation (min): 20 min  PT Assessment / Plan / Recommendation Comments on Treatment Session  Reviewed car transfers with pt and spouse    Follow Up Recommendations  Home health PT     Does the patient have the potential to tolerate intense rehabilitation     Barriers to Discharge        Equipment Recommendations  None recommended by PT    Recommendations for Other Services OT consult  Frequency 7X/week   Plan Discharge plan remains appropriate    Precautions / Restrictions Precautions Precautions: Fall Restrictions Weight Bearing Restrictions: No Other Position/Activity Restrictions: WBAT   Pertinent Vitals/Pain     Mobility  Transfers Transfers: Sit to Stand;Stand to Sit Sit to Stand: 5: Supervision Stand to Sit: 5: Supervision Details for Transfer Assistance: min cues for LE management and use of UEs Ambulation/Gait Ambulation/Gait Assistance: 4: Min guard;5: Supervision Ambulation Distance (Feet): 56 Feet Assistive device: Rolling walker Ambulation/Gait Assistance Details: min cues for position from RW Gait Pattern: Step-to pattern;Step-through pattern;Decreased step length - right;Decreased stance time - left Stairs: Yes Stairs Assistance: 4: Min assist Stairs Assistance Details (indicate cue type and reason): cues for sequence and foot/RW placement; husband assisting on 2nd attempt Stair Management Technique: No rails;Step to pattern;Backwards;With walker Number of Stairs: 1 (twice)    Exercises     PT Diagnosis:    PT Problem List:   PT Treatment Interventions:     PT Goals Acute Rehab PT Goals PT Goal Formulation: With patient Time For Goal Achievement: 05/09/13 Potential to Achieve Goals: Good Pt will go Supine/Side to Sit: with supervision PT Goal: Supine/Side to Sit - Progress: Progressing toward  goal Pt will go Sit to Supine/Side: with supervision PT Goal: Sit to Supine/Side - Progress: Progressing toward goal Pt will go Sit to Stand: with supervision PT Goal: Sit to Stand - Progress: Progressing toward goal Pt will go Stand to Sit: with supervision PT Goal: Stand to Sit - Progress: Progressing toward goal Pt will Ambulate: 51 - 150 feet;with supervision;with rolling walker PT Goal: Ambulate - Progress: Progressing toward goal Pt will Go Up / Down Stairs: 1-2 stairs;with min assist;with least restrictive assistive device PT Goal: Up/Down Stairs - Progress: Progressing toward goal  Visit Information  Last PT Received On: 05/04/13 Assistance Needed: +1    Subjective Data  Subjective: I'm going home today Patient Stated Goal: Resume previous lifestyle with decreased pain   Cognition  Cognition Arousal/Alertness: Awake/alert Behavior During Therapy: WFL for tasks assessed/performed Overall Cognitive Status: Within Functional Limits for tasks assessed    Balance     End of Session PT - End of Session Equipment Utilized During Treatment: Gait belt Activity Tolerance: Patient tolerated treatment well Patient left: in chair;with call bell/phone within reach;with family/visitor present Nurse Communication: Mobility status   GP     [name] 05/04/2013, 2:38 PM

## 2013-05-05 NOTE — Progress Notes (Signed)
Discharge summary sent to payer through [facility]  

## 2013-05-08 NOTE — Discharge Summary (Signed)
Physician Discharge Summary  Patient ID: [name] MRN: [id number] DOB/AGE: [birth year] 61 y.o.  Admit date: 05/02/2013 Discharge date: 05/04/2013   Procedures:  Procedure(s) (LRB): RIGHT TOTAL HIP ARTHROPLASTY ANTERIOR APPROACH (Right)  Attending Physician:  Dr. Durene Romans   Admission Diagnoses:   Right hip OA / pain  Discharge Diagnoses:  Principal Problem:   S/P right THA, AA Active Problems:   Expected blood loss anemia   Obese  Past Medical History  Diagnosis Date  . Reflux   . Hypertension   . Elevated cholesterol   . Hx of right bundle branch block last 5 yrs  . GERD (gastroesophageal reflux disease)   . Arthritis   . Anemia yrs ago    hx of    HPI: [name], 62 y.o. female, has a history of pain and functional disability in the right hip(s) due to arthritis and patient has failed non-surgical conservative treatments for greater than 12 weeks to include NSAID's and/or analgesics, corticosteriod injections and activity modification. Onset of symptoms was gradual starting 2 years ago with rapidlly worsening course since that time.The patient noted no past surgery on the right hip(s). Patient currently rates pain in the right hip at 10 out of 10 with activity. Patient has night pain, worsening of pain with activity and weight bearing, trendelenberg gait, pain that interfers with activities of daily living, pain with passive range of motion and feelsing of the hip giving away on her. Patient has evidence of periarticular osteophytes and joint space narrowing by imaging studies. This condition presents safety issues increasing the risk of falls. There is no current active infection. Risks, benefits and expectations were discussed with the patient. Patient understand the risks, benefits and expectations and wishes to proceed with surgery.   PCP: Georgann Housekeeper, MD   Discharged Condition: good  Hospital Course:  Patient underwent the above stated procedure on  05/02/2013. Patient tolerated the procedure well and brought to the recovery room in good condition and subsequently to the floor.  POD #1 BP: 111/69 ; Pulse: 81 ; Temp: 98.7 F (37.1 C) ; Resp: 14  Pt's foley was removed, as well as the hemovac drain removed. IV was changed to a saline lock. Patient reports pain as mild, pain well controlled. No events throughout the night. Felt a little slow with PT, will see how she does tomorrow.  Neurovascular intact, dorsiflexion/plantar flexion intact, incision: dressing C/D/I, no cellulitis present and compartment soft.   LABS  Basename  05/03/13    0443   HGB  9.9  HCT  30.8   POD #2  BP: 118/68 ; Pulse: 58 ; Temp: 97.8 F (36.6 C) ; Resp: 14  Patient reports pain as mild, pain well controlled. No events throughout the night. Feels better today and ready to be discharged home. Neurovascular intact, dorsiflexion/plantar flexion intact, incision: dressing C/D/I, no cellulitis present and compartment soft.   LABS  Basename  05/04/13    0405   HGB  9.1  HCT  28.6    Discharge Exam: General appearance: alert, cooperative and no distress Extremities: Homans sign is negative, no sign of DVT, no edema, redness or tenderness in the calves or thighs and no ulcers, gangrene or trophic changes  Disposition:   Home-Health Care Svc with follow up in 2 weeks   Follow-up Information   Follow up with [name], MD. Schedule an appointment as soon as possible for a visit in 2 weeks.   Contact information:  [address] [place] [address] [phone]       Discharge Orders   Future Orders Complete By Expires     Call MD / Call 911  As directed     Comments:      If you experience chest pain or shortness of breath, CALL 911 and be transported to the hospital emergency room.  If you develope a fever above 101 F, pus (white drainage) or increased drainage or redness at the wound, or calf pain, call your surgeon's office.     Change dressing  As directed     Comments:      Maintain surgical dressing for 10-14 days, then replace with 4x4 guaze and tape. Keep the area dry and clean.    Constipation Prevention  As directed     Comments:      Drink plenty of fluids.  Prune juice may be helpful.  You may use a stool softener, such as Colace (over the counter) 100 mg twice a day.  Use MiraLax (over the counter) for constipation as needed.    Diet - low sodium heart healthy  As directed     Discharge instructions  As directed     Comments:      Maintain surgical dressing for 10-14 days, then replace with gauze and tape. Keep the area dry and clean until follow up. Follow up in 2 weeks at [facility]. Call with any questions or concerns.    Increase activity slowly as tolerated  As directed     TED hose  As directed     Comments:      Use stockings (TED hose) for 2 weeks on both leg(s).  You may remove them at night for sleeping.    Weight bearing as tolerated  As directed          Medication List    STOP taking these medications       ibuprofen 200 MG tablet  Commonly known as:  ADVIL,MOTRIN     naproxen sodium 220 MG tablet  Commonly known as:  ANAPROX      TAKE these medications       albuterol 108 (90 BASE) MCG/ACT inhaler  Commonly known as:  PROVENTIL HFA;VENTOLIN HFA  Inhale 2 puffs into the lungs every 6 (six) hours as needed for wheezing.     ALPRAZolam 0.5 MG tablet  Commonly known as:  XANAX  Take 0.25 mg by mouth daily as needed for anxiety (when traveling).     aspirin 325 MG EC tablet  Take 1 tablet (325 mg total) by mouth 2 (two) times daily.     cholecalciferol 1000 UNITS tablet  Commonly known as:  VITAMIN D  Take 1,000 Units by mouth as needed.     diltiazem 180 MG 24 hr capsule  Commonly known as:  TIAZAC  Take 180 mg by mouth daily before breakfast.     DSS 100 MG Caps  Take 100 mg by mouth 2 (two) times daily.     DULoxetine 30 MG capsule  Commonly known as:   CYMBALTA  Take 30 mg by mouth daily before breakfast.     ELETONE EX  Apply topically daily. Apply to mosquito bites arms, legs and neck     ferrous sulfate 325 (65 FE) MG tablet  Take 1 tablet (325 mg total) by mouth 3 (three) times daily after meals.     HYDROcodone-acetaminophen 5-325 MG per tablet  Commonly known as:  NORCO/VICODIN  Take 1-2 tablets by mouth every 4 (four) hours as needed.     lisinopril 10 MG tablet  Commonly known as:  PRINIVIL,ZESTRIL  Take 10 mg by mouth daily before breakfast.     omeprazole 40 MG capsule  Commonly known as:  PRILOSEC  Take 40 mg by mouth daily.     polyethylene glycol packet  Commonly known as:  MIRALAX / GLYCOLAX  Take 17 g by mouth daily as needed.     simvastatin 20 MG tablet  Commonly known as:  ZOCOR  Take 20 mg by mouth every evening.     tiZANidine 4 MG capsule  Commonly known as:  ZANAFLEX  Take 1 capsule (4 mg total) by mouth 3 (three) times daily. Muscle spasms         Signed: Anastasio Auerbach. Samnang Shugars   PAC  05/08/2013, 9:16 AM

## 2013-08-30 ENCOUNTER — Other Ambulatory Visit: Payer: Self-pay | Admitting: Obstetrics and Gynecology

## 2014-01-11 ENCOUNTER — Other Ambulatory Visit: Payer: Self-pay | Admitting: Obstetrics and Gynecology

## 2014-08-31 ENCOUNTER — Other Ambulatory Visit (HOSPITAL_COMMUNITY): Payer: Self-pay | Admitting: Orthopedic Surgery

## 2014-08-31 DIAGNOSIS — M25561 Pain in right knee: Secondary | ICD-10-CM

## 2014-09-07 ENCOUNTER — Other Ambulatory Visit (HOSPITAL_COMMUNITY): Payer: Self-pay | Admitting: Orthopedic Surgery

## 2014-09-07 DIAGNOSIS — M25561 Pain in right knee: Secondary | ICD-10-CM

## 2014-09-08 ENCOUNTER — Ambulatory Visit (HOSPITAL_COMMUNITY): Admission: RE | Admit: 2014-09-08 | Payer: Commercial Managed Care - PPO | Source: Ambulatory Visit

## 2014-09-08 ENCOUNTER — Ambulatory Visit (HOSPITAL_COMMUNITY)
Admission: RE | Admit: 2014-09-08 | Discharge: 2014-09-08 | Disposition: A | Discharge status: Home / Self Care | Payer: Commercial Managed Care - PPO | Source: Ambulatory Visit | Attending: Orthopedic Surgery | Admitting: Orthopedic Surgery

## 2014-09-08 ENCOUNTER — Other Ambulatory Visit (HOSPITAL_COMMUNITY): Payer: Self-pay | Admitting: Orthopedic Surgery

## 2014-09-08 ENCOUNTER — Ambulatory Visit (HOSPITAL_COMMUNITY): Admission: RE | Admit: 2014-09-08 | Payer: 59 | Source: Ambulatory Visit

## 2014-09-08 DIAGNOSIS — M25561 Pain in right knee: Secondary | ICD-10-CM | POA: Insufficient documentation

## 2014-09-08 DIAGNOSIS — R52 Pain, unspecified: Secondary | ICD-10-CM

## 2014-10-08 ENCOUNTER — Encounter (HOSPITAL_COMMUNITY): Payer: Self-pay | Admitting: Orthopedic Surgery

## 2014-10-23 ENCOUNTER — Emergency Department (HOSPITAL_COMMUNITY)
Admission: EM | Admit: 2014-10-23 | Discharge: 2014-10-23 | Disposition: A | Discharge status: Home / Self Care | Payer: PRIVATE HEALTH INSURANCE | Attending: Emergency Medicine | Admitting: Emergency Medicine

## 2014-10-23 ENCOUNTER — Encounter (HOSPITAL_COMMUNITY): Payer: Self-pay | Admitting: Emergency Medicine

## 2014-10-23 ENCOUNTER — Emergency Department (HOSPITAL_COMMUNITY): Payer: PRIVATE HEALTH INSURANCE

## 2014-10-23 DIAGNOSIS — Z7982 Long term (current) use of aspirin: Secondary | ICD-10-CM | POA: Insufficient documentation

## 2014-10-23 DIAGNOSIS — K219 Gastro-esophageal reflux disease without esophagitis: Secondary | ICD-10-CM | POA: Insufficient documentation

## 2014-10-23 DIAGNOSIS — D649 Anemia, unspecified: Secondary | ICD-10-CM | POA: Diagnosis not present

## 2014-10-23 DIAGNOSIS — Y990 Civilian activity done for income or pay: Secondary | ICD-10-CM | POA: Insufficient documentation

## 2014-10-23 DIAGNOSIS — M1990 Unspecified osteoarthritis, unspecified site: Secondary | ICD-10-CM | POA: Insufficient documentation

## 2014-10-23 DIAGNOSIS — Z87891 Personal history of nicotine dependence: Secondary | ICD-10-CM | POA: Insufficient documentation

## 2014-10-23 DIAGNOSIS — I10 Essential (primary) hypertension: Secondary | ICD-10-CM | POA: Diagnosis not present

## 2014-10-23 DIAGNOSIS — S8991XA Unspecified injury of right lower leg, initial encounter: Secondary | ICD-10-CM | POA: Diagnosis present

## 2014-10-23 DIAGNOSIS — Z79899 Other long term (current) drug therapy: Secondary | ICD-10-CM | POA: Insufficient documentation

## 2014-10-23 DIAGNOSIS — Z9889 Other specified postprocedural states: Secondary | ICD-10-CM | POA: Insufficient documentation

## 2014-10-23 DIAGNOSIS — W19XXXA Unspecified fall, initial encounter: Secondary | ICD-10-CM

## 2014-10-23 DIAGNOSIS — Y9289 Other specified places as the place of occurrence of the external cause: Secondary | ICD-10-CM | POA: Diagnosis not present

## 2014-10-23 DIAGNOSIS — Y9301 Activity, walking, marching and hiking: Secondary | ICD-10-CM | POA: Diagnosis not present

## 2014-10-23 DIAGNOSIS — E780 Pure hypercholesterolemia: Secondary | ICD-10-CM | POA: Insufficient documentation

## 2014-10-23 DIAGNOSIS — W1830XA Fall on same level, unspecified, initial encounter: Secondary | ICD-10-CM | POA: Insufficient documentation

## 2014-10-23 DIAGNOSIS — S8001XA Contusion of right knee, initial encounter: Secondary | ICD-10-CM | POA: Diagnosis not present

## 2014-10-23 NOTE — Discharge Instructions (Signed)
Contusion °A contusion is a deep bruise. Contusions are the result of an injury that caused bleeding under the skin. The contusion may turn blue, purple, or yellow. Minor injuries will give you a painless contusion, but more severe contusions may stay painful and swollen for a few weeks.  °CAUSES  °A contusion is usually caused by a blow, trauma, or direct force to an area of the body. °SYMPTOMS  °· Swelling and redness of the injured area. °· Bruising of the injured area. °· Tenderness and soreness of the injured area. °· Pain. °DIAGNOSIS  °The diagnosis can be made by taking a history and physical exam. An X-ray, CT scan, or MRI may be needed to determine if there were any associated injuries, such as fractures. °TREATMENT  °Specific treatment will depend on what area of the body was injured. In general, the best treatment for a contusion is resting, icing, elevating, and applying cold compresses to the injured area. Over-the-counter medicines may also be recommended for pain control. Ask your caregiver what the best treatment is for your contusion. °HOME CARE INSTRUCTIONS  °· Put ice on the injured area. °¨ Put ice in a plastic bag. °¨ Place a towel between your skin and the bag. °¨ Leave the ice on for 15-20 minutes, 3-4 times a day, or as directed by your health care provider. °· Only take over-the-counter or prescription medicines for pain, discomfort, or fever as directed by your caregiver. Your caregiver may recommend avoiding anti-inflammatory medicines (aspirin, ibuprofen, and naproxen) for 48 hours because these medicines may increase bruising. °· Rest the injured area. °· If possible, elevate the injured area to reduce swelling. °SEEK IMMEDIATE MEDICAL CARE IF:  °· You have increased bruising or swelling. °· You have pain that is getting worse. °· Your swelling or pain is not relieved with medicines. °MAKE SURE YOU:  °· Understand these instructions. °· Will watch your condition. °· Will get help right  away if you are not doing well or get worse. °Document Released: 09/02/2005 Document Revised: 11/28/2013 Document Reviewed: 09/28/2011 °Exit[employer]® Patient Information ©2015 [employer]. This information is not intended to replace advice given to you by your health care provider. Make sure you discuss any questions you have with your health care provider. ° °

## 2014-10-23 NOTE — ED Notes (Signed)
Called x 1 for triage with no answer 

## 2014-10-23 NOTE — ED Notes (Signed)
PT twisted her right knee and fell to the floor today at work. PT report hx of knee pain. PT ambulatory in triage.

## 2014-10-23 NOTE — ED Provider Notes (Signed)
CSN: [id number]     Arrival date & time 10/23/14  1722 History   First MD Initiated Contact with Patient 10/23/14 1939     Chief Complaint  Patient presents with  . Knee Pain     (Consider location/radiation/quality/duration/timing/severity/associated sxs/prior Treatment) Patient is a 63 y.o. female presenting with knee pain. The history is provided by the patient. No language interpreter was used.  Knee Pain Location:  Knee Injury: yes   Mechanism of injury: fall   Fall:    Fall occurred:  Walking   Point of impact:  Back Knee location:  R knee Pain details:    Quality:  Aching   Radiates to:  Does not radiate   Severity:  Moderate   Onset quality:  Gradual   Timing:  Constant   Progression:  Worsening Chronicity:  New Dislocation: no   Foreign body present:  No foreign bodies Relieved by:  Nothing Worsened by:  Nothing tried Ineffective treatments:  None tried Associated symptoms: no back pain and no swelling   Risk factors: no concern for non-accidental trauma     Past Medical History  Diagnosis Date  . Reflux   . Hypertension   . Elevated cholesterol   . Hx of right bundle branch block last 5 yrs  . GERD (gastroesophageal reflux disease)   . Arthritis   . Anemia yrs ago    hx of   Past Surgical History  Procedure Laterality Date  . Ankle surgery Right 7- 10 yrs ago  . Knee surgery Bilateral  67yrs ago    Arthroscopic  . Appendectomy  3 yrs ago  . Cholecystectomy    . Total hip arthroplasty Right 05/02/2013    Procedure: RIGHT TOTAL HIP ARTHROPLASTY ANTERIOR APPROACH;  Surgeon: Mauri Pole, MD;  Location: [facility];  Service: Orthopedics;  Laterality: Right;   Family History  Problem Relation Age of Onset  . Diabetes Mother   . Hypertension Mother   . Breast cancer Mother 36  . Hypertension Father   . Diabetes Maternal Grandmother   . Colon cancer Maternal Grandfather   . Cancer Maternal Grandfather     rectal-colon  . Heart disease Paternal  Grandmother   . Heart disease Paternal Grandfather    History  Substance Use Topics  . Smoking status: Former Smoker -- 1.00 packs/day for 10 years    Types: Cigarettes    Quit date: 03/31/1979  . Smokeless tobacco: Never Used  . Alcohol Use: Yes     Comment: very rare   OB History    Gravida Para Term Preterm AB TAB SAB Ectopic Multiple Living   1 1 1       1      Review of Systems  Musculoskeletal: Positive for joint swelling. Negative for back pain.  All other systems reviewed and are negative.     Allergies  Codeine  Home Medications   Prior to Admission medications   Medication Sig Start Date End Date Taking? Authorizing Provider  albuterol (PROVENTIL HFA;VENTOLIN HFA) 108 (90 BASE) MCG/ACT inhaler Inhale 2 puffs into the lungs every 6 (six) hours as needed for wheezing.    Historical Provider, MD  ALPRAZolam Duanne Moron) 0.5 MG tablet Take 0.25 mg by mouth daily as needed for anxiety (when traveling).    Historical Provider, MD  aspirin EC 325 MG EC tablet Take 1 tablet (325 mg total) by mouth 2 (two) times daily. 05/03/13   Lucille Passy Babish, PA-C  cholecalciferol (VITAMIN D) 1000 UNITS  tablet Take 1,000 Units by mouth as needed.    Historical Provider, MD  diltiazem (TIAZAC) 180 MG 24 hr capsule Take 180 mg by mouth daily before breakfast.     Historical Provider, MD  docusate sodium 100 MG CAPS Take 100 mg by mouth 2 (two) times daily. 05/03/13   Lucille Passy Babish, PA-C  DULoxetine (CYMBALTA) 30 MG capsule Take 30 mg by mouth daily before breakfast.     Historical Provider, MD  Emollient (ELETONE EX) Apply topically daily. Apply to mosquito bites arms, legs and neck    Historical Provider, MD  ferrous sulfate 325 (65 FE) MG tablet Take 1 tablet (325 mg total) by mouth 3 (three) times daily after meals. 05/03/13   Lucille Passy Babish, PA-C  HYDROcodone-acetaminophen (NORCO/VICODIN) 5-325 MG per tablet Take 1-2 tablets by mouth every 4 (four) hours as needed. 05/03/13    Lucille Passy Babish, PA-C  lisinopril (PRINIVIL,ZESTRIL) 10 MG tablet Take 10 mg by mouth daily before breakfast.    Historical Provider, MD  omeprazole (PRILOSEC) 40 MG capsule Take 40 mg by mouth daily.    Historical Provider, MD  polyethylene glycol (MIRALAX / GLYCOLAX) packet Take 17 g by mouth daily as needed. 05/03/13   Lucille Passy Babish, PA-C  simvastatin (ZOCOR) 20 MG tablet Take 20 mg by mouth every evening.     Historical Provider, MD  tiZANidine (ZANAFLEX) 4 MG capsule Take 1 capsule (4 mg total) by mouth 3 (three) times daily. Muscle spasms 05/03/13   Lucille Passy Babish, PA-C   BP 139/73 mmHg  Pulse 65  Temp(Src) 98.5 F (36.9 C) (Oral)  Resp 18  Ht 5' 4.5" (1.638 m)  Wt 200 lb (90.719 kg)  BMI 33.81 kg/m2  SpO2 100%  LMP 04/19/2013 Physical Exam  Constitutional: She is oriented to person, place, and time. She appears well-developed and well-nourished.  Musculoskeletal: She exhibits tenderness.  Tender right knee,  Mild pain with movement.  nv and ns intact,  No instability,  Neg drawer  Neurological: She is alert and oriented to person, place, and time. She has normal reflexes.  Skin: Skin is warm.  Psychiatric: She has a normal mood and affect.  Nursing note and vitals reviewed.   ED Course  Procedures (including critical care time) Labs Review Labs Reviewed - No data to display  Imaging Review Dg Knee Complete 4 Views Right  10/23/2014   CLINICAL DATA:  Medial right knee pain following twisting injury, initial encounter  EXAM: RIGHT KNEE - COMPLETE 4+ VIEW  COMPARISON:  09/08/2014  FINDINGS: Medial joint space narrowing is noted. No acute fracture or dislocation is seen. No joint effusion is noted.  IMPRESSION: Degenerative changes without acute abnormality.   Electronically Signed   By: Inez Catalina M.D.   On: 10/23/2014 20:15     EKG Interpretation None      MDM   Final diagnoses:  Fall  Contusion of right knee, initial encounter    Pt advised to  follow up with her Md/ Ice to area Return if any problems.    Hollace Kinnier Rahway, PA-C 10/24/14 [place], MD 10/26/14 [phone]

## 2014-11-15 ENCOUNTER — Other Ambulatory Visit: Payer: Self-pay | Admitting: Obstetrics and Gynecology

## 2015-04-22 ENCOUNTER — Other Ambulatory Visit: Payer: Self-pay | Admitting: Obstetrics and Gynecology

## 2015-04-23 LAB — CYTOLOGY - PAP

## 2015-08-15 IMAGING — CR DG KNEE COMPLETE 4+V*R*
4 series · 4 of 4 positions shown · non-contrast
Comparison: 09/08/2014

CLINICAL DATA: Medial right knee pain following twisting injury,
initial encounter

EXAM:
RIGHT KNEE - COMPLETE 4+ VIEW

[view not recorded (1 of 4)]
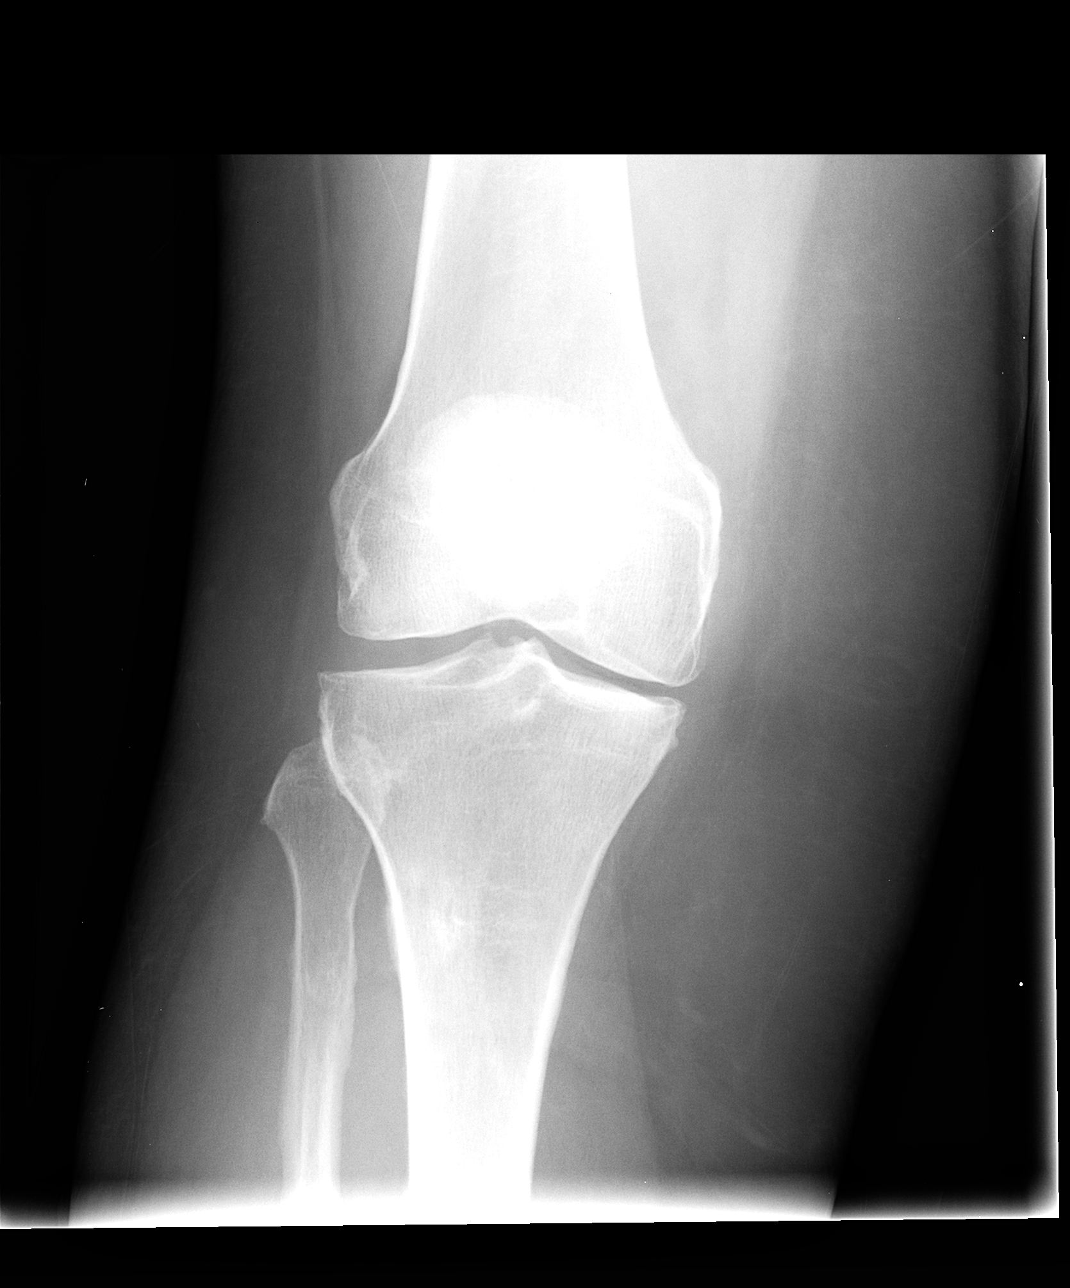

[view not recorded (2 of 4)]
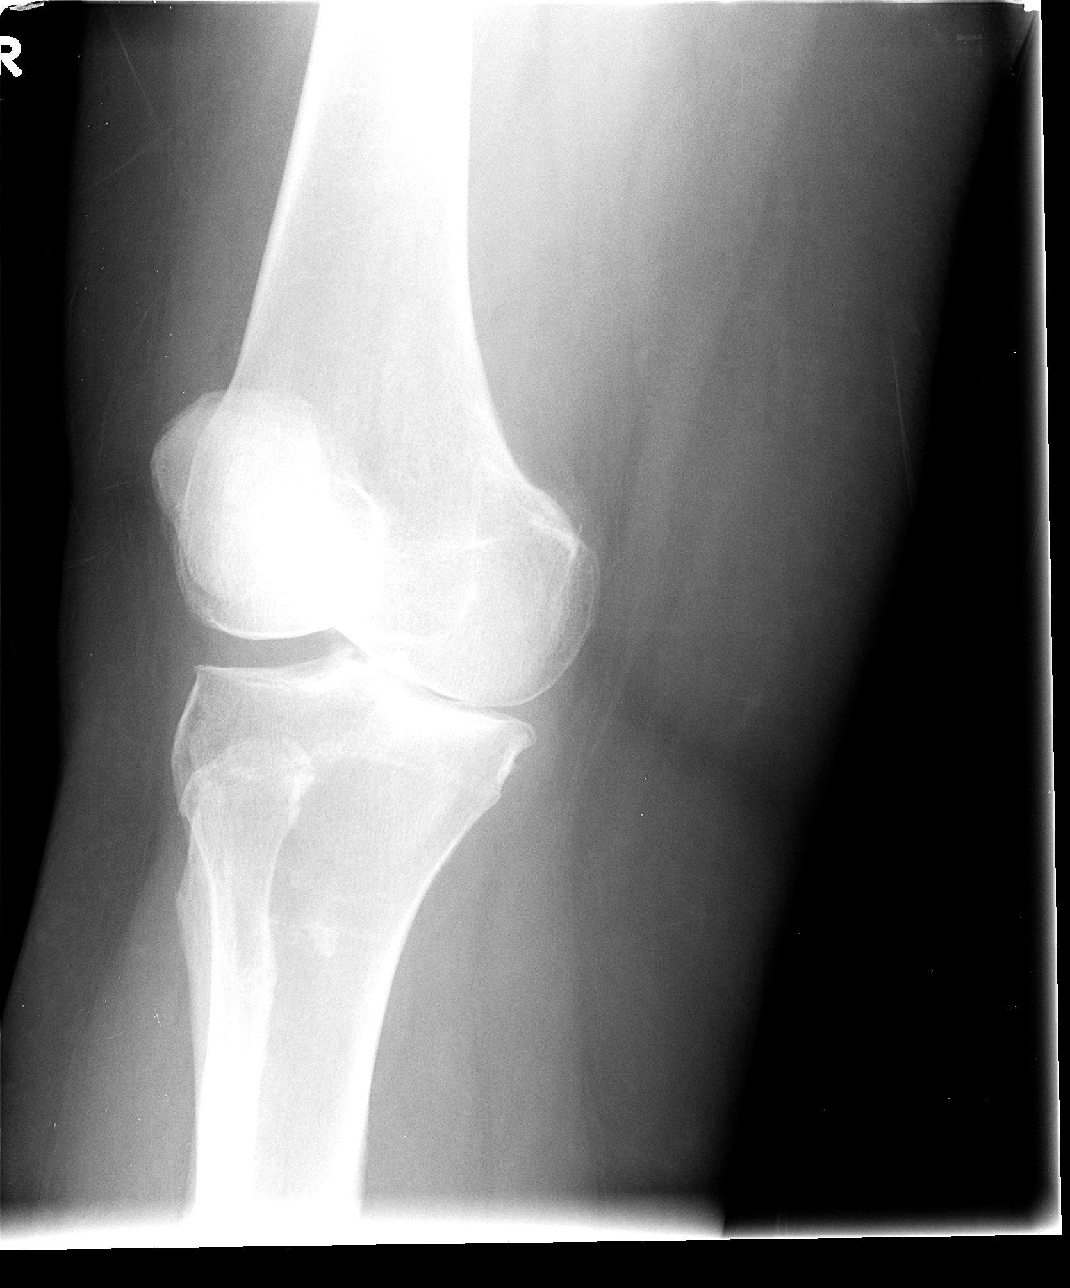

[view not recorded (3 of 4)]
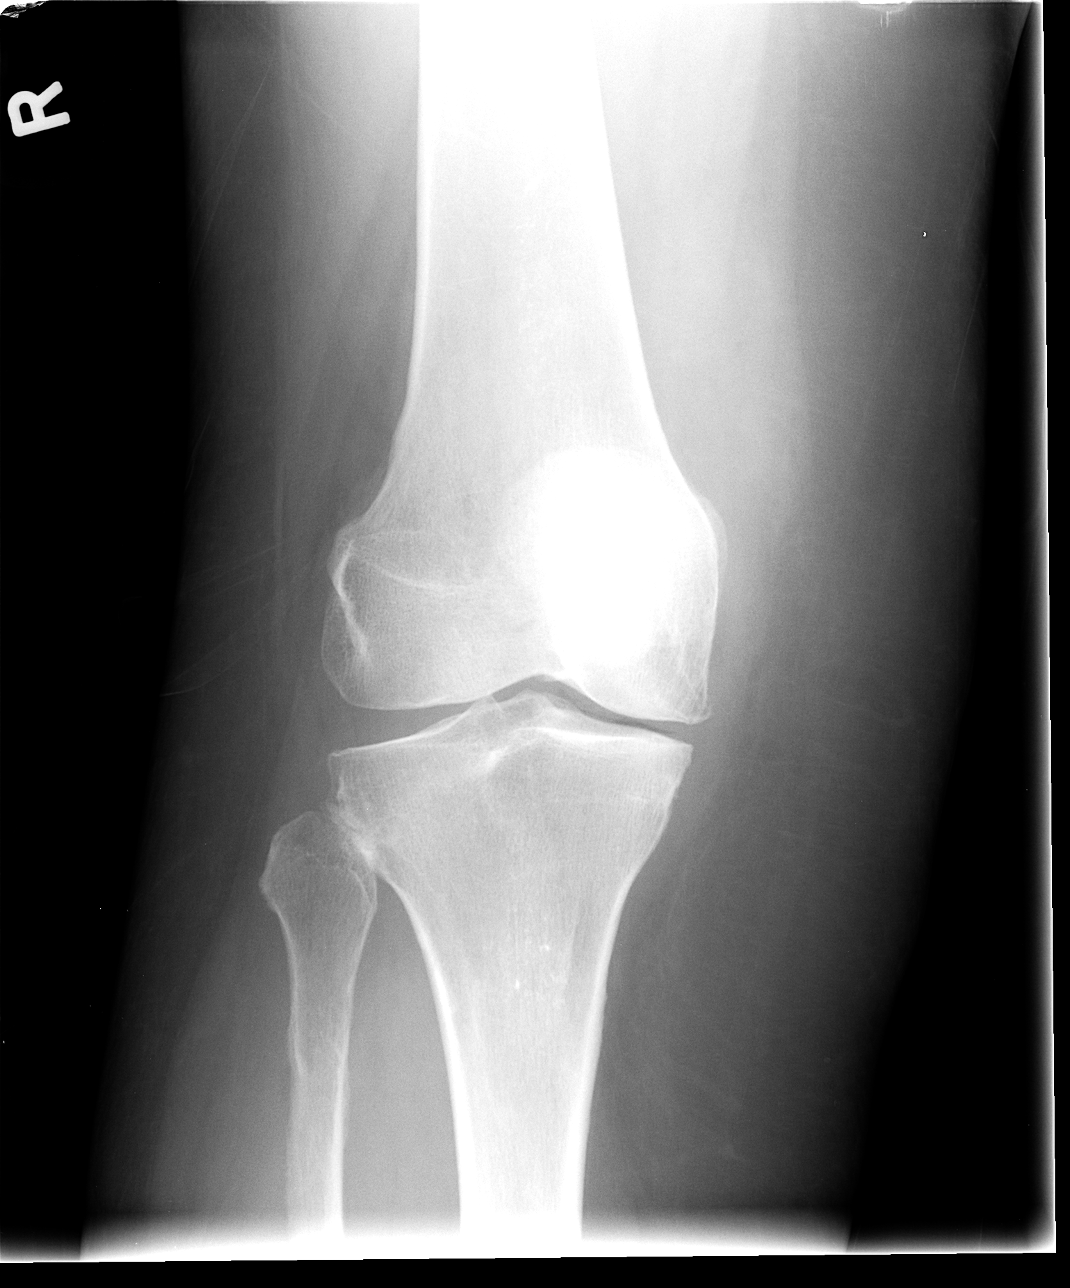

[view not recorded (4 of 4)]
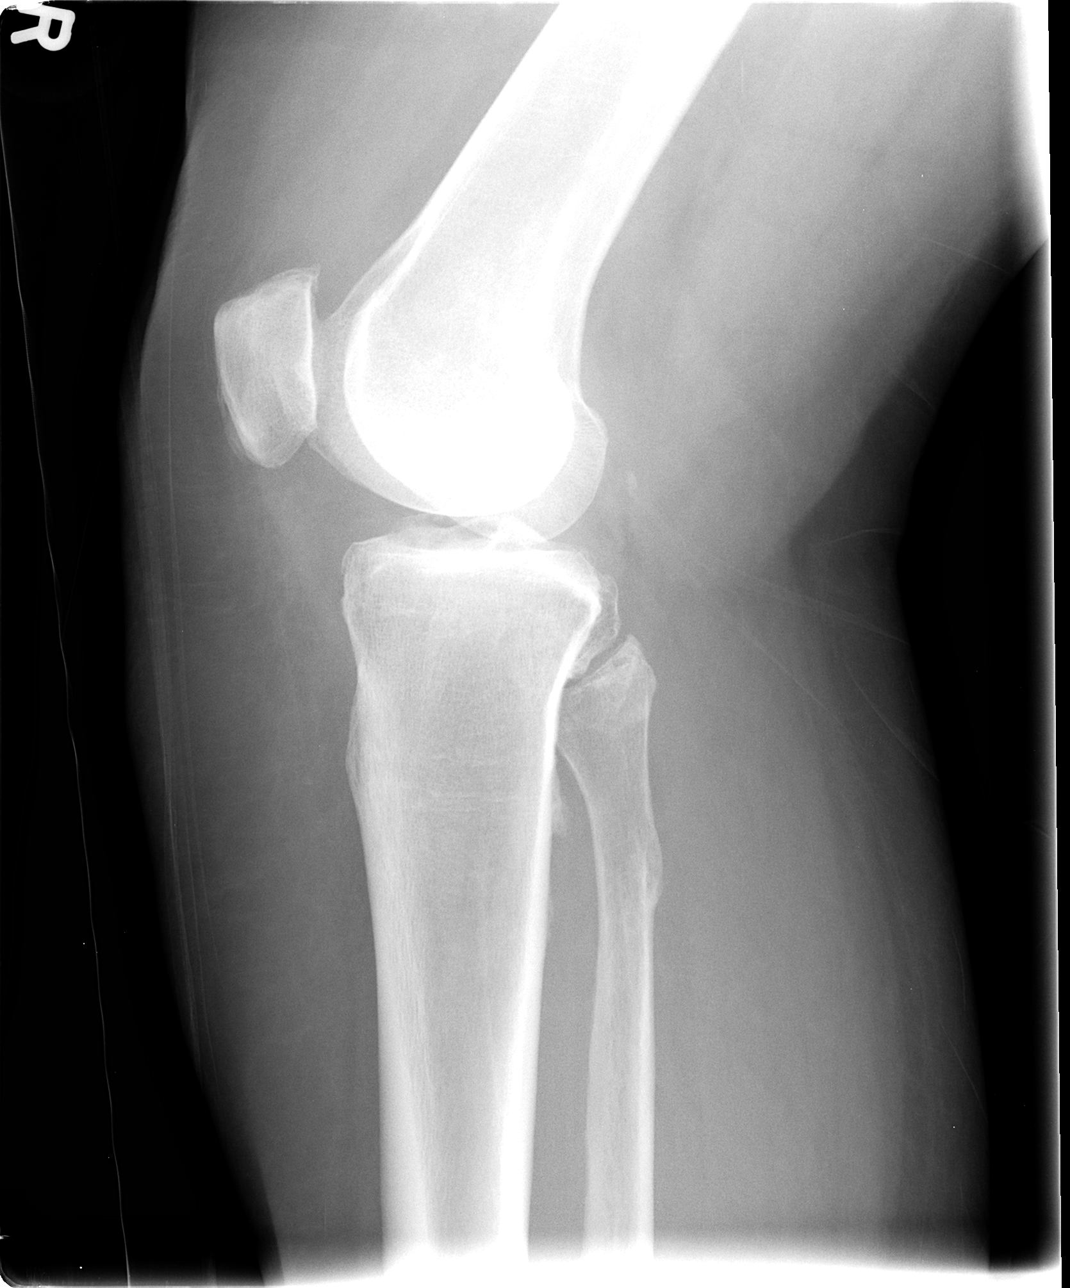

[4 of 4 positions shown; findings below may reference images not displayed]

FINDINGS: Medial joint space narrowing is noted. No acute fracture or
dislocation is seen. No joint effusion is noted.
IMPRESSION: Degenerative changes without acute abnormality.

## 2017-08-30 DIAGNOSIS — M1712 Unilateral primary osteoarthritis, left knee: Secondary | ICD-10-CM | POA: Diagnosis not present

## 2017-08-30 DIAGNOSIS — M1711 Unilateral primary osteoarthritis, right knee: Secondary | ICD-10-CM | POA: Diagnosis not present

## 2017-08-30 DIAGNOSIS — M170 Bilateral primary osteoarthritis of knee: Secondary | ICD-10-CM | POA: Diagnosis not present

## 2017-10-15 DIAGNOSIS — Z6833 Body mass index (BMI) 33.0-33.9, adult: Secondary | ICD-10-CM | POA: Diagnosis not present

## 2017-10-15 DIAGNOSIS — Z01419 Encounter for gynecological examination (general) (routine) without abnormal findings: Secondary | ICD-10-CM | POA: Diagnosis not present

## 2017-11-17 DIAGNOSIS — I10 Essential (primary) hypertension: Secondary | ICD-10-CM | POA: Diagnosis not present

## 2017-11-17 DIAGNOSIS — F324 Major depressive disorder, single episode, in partial remission: Secondary | ICD-10-CM | POA: Diagnosis not present

## 2017-11-17 DIAGNOSIS — K219 Gastro-esophageal reflux disease without esophagitis: Secondary | ICD-10-CM | POA: Diagnosis not present

## 2017-11-17 DIAGNOSIS — E7800 Pure hypercholesterolemia, unspecified: Secondary | ICD-10-CM | POA: Diagnosis not present

## 2017-11-17 DIAGNOSIS — R0981 Nasal congestion: Secondary | ICD-10-CM | POA: Diagnosis not present

## 2017-11-17 DIAGNOSIS — R7303 Prediabetes: Secondary | ICD-10-CM | POA: Diagnosis not present

## 2018-01-10 DIAGNOSIS — M170 Bilateral primary osteoarthritis of knee: Secondary | ICD-10-CM | POA: Diagnosis not present

## 2018-01-26 DIAGNOSIS — J069 Acute upper respiratory infection, unspecified: Secondary | ICD-10-CM | POA: Diagnosis not present

## 2018-01-31 DIAGNOSIS — M170 Bilateral primary osteoarthritis of knee: Secondary | ICD-10-CM | POA: Diagnosis not present

## 2018-02-07 DIAGNOSIS — M170 Bilateral primary osteoarthritis of knee: Secondary | ICD-10-CM | POA: Diagnosis not present

## 2018-02-14 DIAGNOSIS — M1711 Unilateral primary osteoarthritis, right knee: Secondary | ICD-10-CM | POA: Diagnosis not present

## 2018-02-14 DIAGNOSIS — M25562 Pain in left knee: Secondary | ICD-10-CM | POA: Diagnosis not present

## 2018-02-14 DIAGNOSIS — M25561 Pain in right knee: Secondary | ICD-10-CM | POA: Diagnosis not present

## 2018-02-14 DIAGNOSIS — M545 Low back pain: Secondary | ICD-10-CM | POA: Diagnosis not present

## 2018-02-14 DIAGNOSIS — M1712 Unilateral primary osteoarthritis, left knee: Secondary | ICD-10-CM | POA: Diagnosis not present

## 2018-04-01 DIAGNOSIS — M549 Dorsalgia, unspecified: Secondary | ICD-10-CM | POA: Diagnosis not present

## 2018-04-01 DIAGNOSIS — M48062 Spinal stenosis, lumbar region with neurogenic claudication: Secondary | ICD-10-CM | POA: Diagnosis not present

## 2018-04-01 DIAGNOSIS — M546 Pain in thoracic spine: Secondary | ICD-10-CM | POA: Diagnosis not present

## 2018-04-01 DIAGNOSIS — M5136 Other intervertebral disc degeneration, lumbar region: Secondary | ICD-10-CM | POA: Diagnosis not present

## 2018-04-01 DIAGNOSIS — M5416 Radiculopathy, lumbar region: Secondary | ICD-10-CM | POA: Diagnosis not present

## 2018-04-01 DIAGNOSIS — M47816 Spondylosis without myelopathy or radiculopathy, lumbar region: Secondary | ICD-10-CM | POA: Diagnosis not present

## 2018-05-16 DIAGNOSIS — E7800 Pure hypercholesterolemia, unspecified: Secondary | ICD-10-CM | POA: Diagnosis not present

## 2018-05-16 DIAGNOSIS — Z1389 Encounter for screening for other disorder: Secondary | ICD-10-CM | POA: Diagnosis not present

## 2018-05-16 DIAGNOSIS — I10 Essential (primary) hypertension: Secondary | ICD-10-CM | POA: Diagnosis not present

## 2018-05-16 DIAGNOSIS — Z0000 Encounter for general adult medical examination without abnormal findings: Secondary | ICD-10-CM | POA: Diagnosis not present

## 2018-05-16 DIAGNOSIS — F419 Anxiety disorder, unspecified: Secondary | ICD-10-CM | POA: Diagnosis not present

## 2018-05-16 DIAGNOSIS — Z23 Encounter for immunization: Secondary | ICD-10-CM | POA: Diagnosis not present

## 2018-05-16 DIAGNOSIS — Z136 Encounter for screening for cardiovascular disorders: Secondary | ICD-10-CM | POA: Diagnosis not present

## 2018-05-16 DIAGNOSIS — R7303 Prediabetes: Secondary | ICD-10-CM | POA: Diagnosis not present

## 2018-05-16 DIAGNOSIS — F324 Major depressive disorder, single episode, in partial remission: Secondary | ICD-10-CM | POA: Diagnosis not present

## 2018-05-16 DIAGNOSIS — M179 Osteoarthritis of knee, unspecified: Secondary | ICD-10-CM | POA: Diagnosis not present

## 2018-05-16 DIAGNOSIS — K219 Gastro-esophageal reflux disease without esophagitis: Secondary | ICD-10-CM | POA: Diagnosis not present

## 2018-05-16 DIAGNOSIS — E559 Vitamin D deficiency, unspecified: Secondary | ICD-10-CM | POA: Diagnosis not present

## 2018-05-31 DIAGNOSIS — Z803 Family history of malignant neoplasm of breast: Secondary | ICD-10-CM | POA: Diagnosis not present

## 2018-05-31 DIAGNOSIS — Z1231 Encounter for screening mammogram for malignant neoplasm of breast: Secondary | ICD-10-CM | POA: Diagnosis not present

## 2018-06-01 DIAGNOSIS — M179 Osteoarthritis of knee, unspecified: Secondary | ICD-10-CM | POA: Diagnosis not present

## 2018-06-01 DIAGNOSIS — F324 Major depressive disorder, single episode, in partial remission: Secondary | ICD-10-CM | POA: Diagnosis not present

## 2018-06-01 DIAGNOSIS — I10 Essential (primary) hypertension: Secondary | ICD-10-CM | POA: Diagnosis not present

## 2018-06-02 DIAGNOSIS — N6089 Other benign mammary dysplasias of unspecified breast: Secondary | ICD-10-CM | POA: Diagnosis not present

## 2018-06-02 DIAGNOSIS — R928 Other abnormal and inconclusive findings on diagnostic imaging of breast: Secondary | ICD-10-CM | POA: Diagnosis not present

## 2018-09-01 DIAGNOSIS — Z803 Family history of malignant neoplasm of breast: Secondary | ICD-10-CM | POA: Diagnosis not present

## 2018-09-01 DIAGNOSIS — N6311 Unspecified lump in the right breast, upper outer quadrant: Secondary | ICD-10-CM | POA: Diagnosis not present

## 2018-09-14 DIAGNOSIS — J0100 Acute maxillary sinusitis, unspecified: Secondary | ICD-10-CM | POA: Diagnosis not present

## 2018-10-07 DIAGNOSIS — M25561 Pain in right knee: Secondary | ICD-10-CM | POA: Diagnosis not present

## 2018-10-07 DIAGNOSIS — M25562 Pain in left knee: Secondary | ICD-10-CM | POA: Diagnosis not present

## 2018-10-07 DIAGNOSIS — M1712 Unilateral primary osteoarthritis, left knee: Secondary | ICD-10-CM | POA: Diagnosis not present

## 2018-10-07 DIAGNOSIS — M1711 Unilateral primary osteoarthritis, right knee: Secondary | ICD-10-CM | POA: Diagnosis not present

## 2018-10-19 DIAGNOSIS — M25562 Pain in left knee: Secondary | ICD-10-CM | POA: Diagnosis not present

## 2018-10-19 DIAGNOSIS — M1712 Unilateral primary osteoarthritis, left knee: Secondary | ICD-10-CM | POA: Diagnosis not present

## 2018-10-19 DIAGNOSIS — M1711 Unilateral primary osteoarthritis, right knee: Secondary | ICD-10-CM | POA: Diagnosis not present

## 2018-10-27 DIAGNOSIS — Z01419 Encounter for gynecological examination (general) (routine) without abnormal findings: Secondary | ICD-10-CM | POA: Diagnosis not present

## 2018-10-27 DIAGNOSIS — Z6833 Body mass index (BMI) 33.0-33.9, adult: Secondary | ICD-10-CM | POA: Diagnosis not present

## 2018-11-21 DIAGNOSIS — N390 Urinary tract infection, site not specified: Secondary | ICD-10-CM | POA: Diagnosis not present

## 2018-11-29 DIAGNOSIS — F419 Anxiety disorder, unspecified: Secondary | ICD-10-CM | POA: Diagnosis not present

## 2018-11-29 DIAGNOSIS — F324 Major depressive disorder, single episode, in partial remission: Secondary | ICD-10-CM | POA: Diagnosis not present

## 2018-11-29 DIAGNOSIS — M179 Osteoarthritis of knee, unspecified: Secondary | ICD-10-CM | POA: Diagnosis not present

## 2018-11-29 DIAGNOSIS — K219 Gastro-esophageal reflux disease without esophagitis: Secondary | ICD-10-CM | POA: Diagnosis not present

## 2018-11-29 DIAGNOSIS — I10 Essential (primary) hypertension: Secondary | ICD-10-CM | POA: Diagnosis not present

## 2018-11-29 DIAGNOSIS — E7800 Pure hypercholesterolemia, unspecified: Secondary | ICD-10-CM | POA: Diagnosis not present

## 2018-11-29 DIAGNOSIS — R7303 Prediabetes: Secondary | ICD-10-CM | POA: Diagnosis not present

## 2019-02-02 DIAGNOSIS — L814 Other melanin hyperpigmentation: Secondary | ICD-10-CM | POA: Diagnosis not present

## 2019-02-02 DIAGNOSIS — L821 Other seborrheic keratosis: Secondary | ICD-10-CM | POA: Diagnosis not present

## 2019-02-02 DIAGNOSIS — L819 Disorder of pigmentation, unspecified: Secondary | ICD-10-CM | POA: Diagnosis not present

## 2019-02-02 DIAGNOSIS — L570 Actinic keratosis: Secondary | ICD-10-CM | POA: Diagnosis not present

## 2019-02-02 DIAGNOSIS — D225 Melanocytic nevi of trunk: Secondary | ICD-10-CM | POA: Diagnosis not present

## 2019-02-02 DIAGNOSIS — D1801 Hemangioma of skin and subcutaneous tissue: Secondary | ICD-10-CM | POA: Diagnosis not present

## 2019-02-23 DIAGNOSIS — F324 Major depressive disorder, single episode, in partial remission: Secondary | ICD-10-CM | POA: Diagnosis not present

## 2019-02-23 DIAGNOSIS — I10 Essential (primary) hypertension: Secondary | ICD-10-CM | POA: Diagnosis not present

## 2019-02-23 DIAGNOSIS — M179 Osteoarthritis of knee, unspecified: Secondary | ICD-10-CM | POA: Diagnosis not present

## 2019-04-24 DIAGNOSIS — H524 Presbyopia: Secondary | ICD-10-CM | POA: Diagnosis not present

## 2019-04-24 DIAGNOSIS — H2513 Age-related nuclear cataract, bilateral: Secondary | ICD-10-CM | POA: Diagnosis not present

## 2019-04-24 DIAGNOSIS — H52203 Unspecified astigmatism, bilateral: Secondary | ICD-10-CM | POA: Diagnosis not present

## 2019-04-25 DIAGNOSIS — M179 Osteoarthritis of knee, unspecified: Secondary | ICD-10-CM | POA: Diagnosis not present

## 2019-04-25 DIAGNOSIS — E7800 Pure hypercholesterolemia, unspecified: Secondary | ICD-10-CM | POA: Diagnosis not present

## 2019-04-25 DIAGNOSIS — I10 Essential (primary) hypertension: Secondary | ICD-10-CM | POA: Diagnosis not present

## 2019-04-25 DIAGNOSIS — F324 Major depressive disorder, single episode, in partial remission: Secondary | ICD-10-CM | POA: Diagnosis not present

## 2019-04-26 DIAGNOSIS — M25551 Pain in right hip: Secondary | ICD-10-CM | POA: Diagnosis not present

## 2019-05-24 DIAGNOSIS — I10 Essential (primary) hypertension: Secondary | ICD-10-CM | POA: Diagnosis not present

## 2019-05-24 DIAGNOSIS — M179 Osteoarthritis of knee, unspecified: Secondary | ICD-10-CM | POA: Diagnosis not present

## 2019-05-24 DIAGNOSIS — F324 Major depressive disorder, single episode, in partial remission: Secondary | ICD-10-CM | POA: Diagnosis not present

## 2019-05-24 DIAGNOSIS — E7800 Pure hypercholesterolemia, unspecified: Secondary | ICD-10-CM | POA: Diagnosis not present

## 2019-06-06 DIAGNOSIS — Z803 Family history of malignant neoplasm of breast: Secondary | ICD-10-CM | POA: Diagnosis not present

## 2019-06-06 DIAGNOSIS — S2001XA Contusion of right breast, initial encounter: Secondary | ICD-10-CM | POA: Diagnosis not present

## 2019-06-06 DIAGNOSIS — N6489 Other specified disorders of breast: Secondary | ICD-10-CM | POA: Diagnosis not present

## 2019-06-19 DIAGNOSIS — H43392 Other vitreous opacities, left eye: Secondary | ICD-10-CM | POA: Diagnosis not present

## 2019-06-23 DIAGNOSIS — E7800 Pure hypercholesterolemia, unspecified: Secondary | ICD-10-CM | POA: Diagnosis not present

## 2019-06-23 DIAGNOSIS — M179 Osteoarthritis of knee, unspecified: Secondary | ICD-10-CM | POA: Diagnosis not present

## 2019-06-23 DIAGNOSIS — F324 Major depressive disorder, single episode, in partial remission: Secondary | ICD-10-CM | POA: Diagnosis not present

## 2019-06-23 DIAGNOSIS — I10 Essential (primary) hypertension: Secondary | ICD-10-CM | POA: Diagnosis not present

## 2019-07-11 DIAGNOSIS — I10 Essential (primary) hypertension: Secondary | ICD-10-CM | POA: Diagnosis not present

## 2019-07-11 DIAGNOSIS — E7800 Pure hypercholesterolemia, unspecified: Secondary | ICD-10-CM | POA: Diagnosis not present

## 2019-07-11 DIAGNOSIS — M179 Osteoarthritis of knee, unspecified: Secondary | ICD-10-CM | POA: Diagnosis not present

## 2019-07-11 DIAGNOSIS — F324 Major depressive disorder, single episode, in partial remission: Secondary | ICD-10-CM | POA: Diagnosis not present

## 2019-08-30 DIAGNOSIS — M1711 Unilateral primary osteoarthritis, right knee: Secondary | ICD-10-CM | POA: Diagnosis not present

## 2019-08-30 DIAGNOSIS — M170 Bilateral primary osteoarthritis of knee: Secondary | ICD-10-CM | POA: Diagnosis not present

## 2019-08-30 DIAGNOSIS — M1712 Unilateral primary osteoarthritis, left knee: Secondary | ICD-10-CM | POA: Diagnosis not present

## 2019-09-13 NOTE — H&P (Signed)
TOTAL KNEE ADMISSION H&P  Patient is being admitted for right total knee arthroplasty.  Subjective:  Chief Complaint:   Right knee primary OA / pain  HPI: [name], 68 y.o. female, has a history of pain and functional disability in the right knee due to arthritis and has failed non-surgical conservative treatments for greater than 12 weeks to include NSAID's and/or analgesics, corticosteriod injections, viscosupplementation injections and activity modification.  Onset of symptoms was gradual, starting 4 years ago with gradually worsening course since that time. The patient noted prior procedures on the knee to include  arthroscopy and menisectomy on the right knee(s).  Patient currently rates pain in the right knee(s) at 10 out of 10 with activity. Patient has night pain, worsening of pain with activity and weight bearing, pain that interferes with activities of daily living, pain with passive range of motion, crepitus and joint swelling.  Patient has evidence of periarticular osteophytes and joint space narrowing by imaging studies.  There is no active infection.  Risks, benefits and expectations were discussed with the patient.  Risks including but not limited to the risk of anesthesia, blood clots, nerve damage, blood vessel damage, failure of the prosthesis, infection and up to and including death.  Patient understand the risks, benefits and expectations and wishes to proceed with surgery.   PCP: Wenda Low, MD  D/C Plans:       Home   Post-op Meds:       No Rx given  Tranexamic Acid:      To be given - IV   Decadron:      Is to be given  FYI:      ASA  Dilaudid  DME:   Pt equipment Rxs sent  PT:   OPPT   Pharmacy: [facility], [place], [place] [id number]   Patient Active Problem List   Diagnosis Date Noted  . S/P right THA, AA 05/03/2013  . Expected blood loss anemia 05/03/2013  . Obese 05/03/2013  . Postmenopausal bleeding 03/30/2013  . Elevated cholesterol    . Reflux   . Hypertension    Past Medical History:  Diagnosis Date  . Anemia yrs ago   hx of  . Arthritis   . Elevated cholesterol   . GERD (gastroesophageal reflux disease)   . Hx of right bundle branch block last 5 yrs  . Hypertension   . Reflux     Past Surgical History:  Procedure Laterality Date  . ANKLE SURGERY Right 7- 10 yrs ago  . APPENDECTOMY  3 yrs ago  . CHOLECYSTECTOMY    . KNEE SURGERY Bilateral  79yrs ago   Arthroscopic  . TOTAL HIP ARTHROPLASTY Right 05/02/2013   Procedure: RIGHT TOTAL HIP ARTHROPLASTY ANTERIOR APPROACH;  Surgeon: Mauri Pole, MD;  Location: [facility];  Service: Orthopedics;  Laterality: Right;    No current facility-administered medications for this encounter.    Current Outpatient Medications  Medication Sig Dispense Refill Last Dose  . albuterol (PROVENTIL HFA;VENTOLIN HFA) 108 (90 BASE) MCG/ACT inhaler Inhale 2 puffs into the lungs every 6 (six) hours as needed for wheezing.   1 year ago   . ALPRAZolam (XANAX) 0.5 MG tablet Take 0.25 mg by mouth daily as needed for anxiety (when traveling).   1 week ago  . aspirin EC 325 MG EC tablet Take 1 tablet (325 mg total) by mouth 2 (two) times daily. 60 tablet 0   . cholecalciferol (VITAMIN D) 1000 UNITS tablet  Take 1,000 Units by mouth as needed.   10/23/2014  . diltiazem (TIAZAC) 180 MG 24 hr capsule Take 180 mg by mouth daily before breakfast.    10/23/2014  . docusate sodium 100 MG CAPS Take 100 mg by mouth 2 (two) times daily. 10 capsule 0   . DULoxetine (CYMBALTA) 30 MG capsule Take 30 mg by mouth daily before breakfast.    10/23/2014  . Emollient (ELETONE EX) Apply topically daily. Apply to mosquito bites arms, legs and neck   1 week ago  . ferrous sulfate 325 (65 FE) MG tablet Take 1 tablet (325 mg total) by mouth 3 (three) times daily after meals.  3   . HYDROcodone-acetaminophen (NORCO/VICODIN) 5-325 MG per tablet Take 1-2 tablets by mouth every 4 (four) hours as needed. 120 tablet 0   .  lisinopril (PRINIVIL,ZESTRIL) 10 MG tablet Take 10 mg by mouth daily before breakfast.   10/23/2014  . omeprazole (PRILOSEC) 40 MG capsule Take 40 mg by mouth daily.   10/23/2014  . polyethylene glycol (MIRALAX / GLYCOLAX) packet Take 17 g by mouth daily as needed. 14 each 0   . simvastatin (ZOCOR) 20 MG tablet Take 20 mg by mouth every evening.    10/23/2014  . tiZANidine (ZANAFLEX) 4 MG capsule Take 1 capsule (4 mg total) by mouth 3 (three) times daily. Muscle spasms 50 capsule 0    Allergies  Allergen Reactions  . Codeine Palpitations    Social History   Tobacco Use  . Smoking status: Former Smoker    Packs/day: 1.00    Years: 10.00    Pack years: 10.00    Types: Cigarettes    Quit date: 03/31/1979    Years since quitting: 40.4  . Smokeless tobacco: Never Used  Substance Use Topics  . Alcohol use: Yes    Comment: very rare    Family History  Problem Relation Age of Onset  . Diabetes Mother   . Hypertension Mother   . Breast cancer Mother 43  . Hypertension Father   . Diabetes Maternal Grandmother   . Colon cancer Maternal Grandfather   . Cancer Maternal Grandfather        rectal-colon  . Heart disease Paternal Grandmother   . Heart disease Paternal Grandfather      Review of Systems  Constitutional: Negative.   HENT: Negative.   Eyes: Negative.   Respiratory: Negative.   Cardiovascular: Negative.   Gastrointestinal: Positive for heartburn.  Genitourinary: Negative.   Musculoskeletal: Positive for joint pain.  Skin: Negative.   Neurological: Negative.   Endo/Heme/Allergies: Negative.   Psychiatric/Behavioral: Negative.     Objective:  Physical Exam  Constitutional: She is oriented to person, place, and time. She appears well-developed.  HENT:  Head: Normocephalic.  Eyes: Pupils are equal, round, and reactive to light.  Neck: Neck supple. No JVD present. No tracheal deviation present. No thyromegaly present.  Cardiovascular: Normal rate, regular rhythm  and intact distal pulses.  Respiratory: Effort normal and breath sounds normal. No respiratory distress. She has no wheezes.  GI: Soft. There is no abdominal tenderness. There is no guarding.  Musculoskeletal:     Right knee: She exhibits decreased range of motion, swelling and bony tenderness. She exhibits no ecchymosis, no deformity, no laceration and no erythema. Tenderness found.  Lymphadenopathy:    She has no cervical adenopathy.  Neurological: She is alert and oriented to person, place, and time.  Skin: Skin is warm and dry.  Psychiatric: She has a  normal mood and affect.      Labs:  Estimated body mass index is 33.8 kg/m as calculated from the following:   Height as of 10/23/14: 5' 4.5" (1.638 m).   Weight as of 10/23/14: 90.7 kg.   Imaging Review Plain radiographs demonstrate severe degenerative joint disease of the right knee(s). The bone quality appears to be good for age and reported activity level.      Assessment/Plan:  End stage arthritis, right knee   The patient history, physical examination, clinical judgment of the provider and imaging studies are consistent with end stage degenerative joint disease of the right knee(s) and total knee arthroplasty is deemed medically necessary. The treatment options including medical management, injection therapy arthroscopy and arthroplasty were discussed at length. The risks and benefits of total knee arthroplasty were presented and reviewed. The risks due to aseptic loosening, infection, stiffness, patella tracking problems, thromboembolic complications and other imponderables were discussed. The patient acknowledged the explanation, agreed to proceed with the plan and consent was signed. Patient is being admitted for inpatient treatment for surgery, pain control, PT, OT, prophylactic antibiotics, VTE prophylaxis, progressive ambulation and ADL's and discharge planning. The patient is planning to be discharged  home.    Patient's anticipated LOS is less than 2 midnights, meeting these requirements: - Lives within 1 hour of care - Has a competent adult at home to recover with post-op recover - NO history of  - Chronic pain requiring opiods  - Diabetes  - Coronary Artery Disease  - Heart failure  - Heart attack  - Stroke  - DVT/VTE  - Cardiac arrhythmia  - Respiratory Failure/COPD  - Renal failure  - Anemia  - Advanced Liver disease    West Pugh. Shai Mckenzie   PA-C  09/13/2019, 1:02 PM

## 2019-09-15 ENCOUNTER — Encounter (HOSPITAL_COMMUNITY): Payer: Self-pay

## 2019-09-15 NOTE — Patient Instructions (Addendum)
DUE TO COVID-19 ONLY ONE VISITOR IS ALLOWED TO COME WITH YOU AND STAY IN THE WAITING ROOM ONLY DURING PRE OP AND PROCEDURE DAY OF SURGERY. THE 1 VISITOR MAY VISIT WITH YOU AFTER SURGERY IN YOUR PRIVATE ROOM DURING VISITING HOURS ONLY!  YOU NEED TO HAVE A COVID 19 TEST ON_Friday_10/16/2020_____ @__0905  am_____, THIS TEST MUST BE DONE BEFORE SURGERY, COME  [address] [place] , [id number].  ([place]) ONCE YOUR COVID TEST IS COMPLETED, PLEASE BEGIN THE QUARANTINE INSTRUCTIONS AS OUTLINED IN YOUR HANDOUT.                [name]   Your procedure is scheduled on: Tuesday 09/26/2019   Report to [facility] Main  Entrance    Report to admitting at 7:35 AM     Call this number if you have problems the morning of surgery [phone]    Remember: Do not eat food After Midnight.    BRUSH YOUR TEETH MORNING OF SURGERY AND RINSE YOUR MOUTH OUT, NO CHEWING GUM CANDY OR MINTS.   NO SOLID FOOD AFTER MIDNIGHT THE NIGHT PRIOR TO SURGERY. NOTHING BY MOUTH EXCEPT CLEAR LIQUIDS UNTIL 7:05am.    PLEASE FINISH ENSURE DRINK PER SURGEON ORDER  WHICH NEEDS TO BE COMPLETED AT 7:05am .   CLEAR LIQUID DIET   Foods Allowed                                                                     Foods Excluded  Coffee and tea, regular and decaf                             liquids that you cannot  Plain Jell-O any favor except red or purple             see through such as: Fruit ices (not with fruit pulp)                                     milk, soups, orange juice  Iced Popsicles                                    All solid food Carbonated beverages, regular and diet                                    Cranberry, grape and apple juices Sports drinks like Gatorade Lightly seasoned clear broth or consume(fat free) Sugar, honey syrup  Sample Menu Breakfast                                Lunch                                     Supper Cranberry juice  Beef broth                            Chicken broth Jell-O                                     Grape juice                           Apple juice Coffee or tea                        Jell-O                                      Popsicle                                                Coffee or tea                        Coffee or tea  _____________________________________________________________________       Take these medicines the morning of surgery with A SIP OF WATER: Duloxetine (Cymbalta), Diltiazem (Ziac), Omprazole (Prilosec) Albuterol inhaler if needed. Please bring inhaler with you to the hospital.                                  You may not have any metal on your body including hair pins and              piercings  Do not wear jewelry, make-up, lotions, powders or perfumes, deodorant             Do not wear nail polish on your fingernails.  Do not shave  48 hours prior to surgery.                Do not bring valuables to the hospital. [place].  Contacts, dentures or bridgework may not be worn into surgery.  Leave suitcase in the car. After surgery it may be brought to your room.                  Please read over the following fact sheets you were given: _____________________________________________________________________             [facility] - Preparing for Surgery Before surgery, you can play an important role.  Because skin is not sterile, your skin needs to be as free of germs as possible.  You can reduce the number of germs on your skin by washing with CHG (chlorahexidine gluconate) soap before surgery.  CHG is an antiseptic cleaner which kills germs and bonds with the skin to continue killing germs even after washing. Please DO NOT use if you have an allergy to CHG or antibacterial soaps.  If your skin becomes reddened/irritated stop using the CHG and inform your nurse when you arrive at Short Stay. Do not shave  (including legs and  underarms) for at least 48 hours prior to the first CHG shower.  You may shave your face/neck. Please follow these instructions carefully:  1.  Shower with CHG Soap the night before surgery and the  morning of Surgery.  2.  If you choose to wash your hair, wash your hair first as usual with your  normal  shampoo.  3.  After you shampoo, rinse your hair and body thoroughly to remove the  shampoo.                           4.  Use CHG as you would any other liquid soap.  You can apply chg directly  to the skin and wash                       Gently with a scrungie or clean washcloth.  5.  Apply the CHG Soap to your body ONLY FROM THE NECK DOWN.   Do not use on face/ open                           Wound or open sores. Avoid contact with eyes, ears mouth and genitals (private parts).                       Wash face,  Genitals (private parts) with your normal soap.             6.  Wash thoroughly, paying special attention to the area where your surgery  will be performed.  7.  Thoroughly rinse your body with warm water from the neck down.  8.  DO NOT shower/wash with your normal soap after using and rinsing off  the CHG Soap.                9.  Pat yourself dry with a clean towel.            10.  Wear clean pajamas.            11.  Place clean sheets on your bed the night of your first shower and do not  sleep with pets. Day of Surgery : Do not apply any lotions/deodorants the morning of surgery.  Please wear clean clothes to the hospital/surgery center.  FAILURE TO FOLLOW THESE INSTRUCTIONS MAY RESULT IN THE CANCELLATION OF YOUR SURGERY PATIENT SIGNATURE_________________________________  NURSE SIGNATURE__________________________________  ________________________________________________________________________   [name]  An incentive spirometer is a tool that can help keep your lungs clear and active. This tool measures how well you are filling your lungs with  each breath. Taking long deep breaths may help reverse or decrease the chance of developing breathing (pulmonary) problems (especially infection) following:  A long period of time when you are unable to move or be active. BEFORE THE PROCEDURE   If the spirometer includes an indicator to show your best effort, your nurse or respiratory therapist will set it to a desired goal.  If possible, sit up straight or lean slightly forward. Try not to slouch.  Hold the incentive spirometer in an upright position. INSTRUCTIONS FOR USE  1. Sit on the edge of your bed if possible, or sit up as far as you can in bed or on a chair. 2. Hold the incentive spirometer in an upright position. 3. Breathe out normally. 4. Place the mouthpiece in your mouth and seal your lips  tightly around it. 5. Breathe in slowly and as deeply as possible, raising the piston or the ball toward the top of the column. 6. Hold your breath for 3-5 seconds or for as long as possible. Allow the piston or ball to fall to the bottom of the column. 7. Remove the mouthpiece from your mouth and breathe out normally. 8. Rest for a few seconds and repeat Steps 1 through 7 at least 10 times every 1-2 hours when you are awake. Take your time and take a few normal breaths between deep breaths. 9. The spirometer may include an indicator to show your best effort. Use the indicator as a goal to work toward during each repetition. 10. After each set of 10 deep breaths, practice coughing to be sure your lungs are clear. If you have an incision (the cut made at the time of surgery), support your incision when coughing by placing a pillow or rolled up towels firmly against it. Once you are able to get out of bed, walk around indoors and cough well. You may stop using the incentive spirometer when instructed by your caregiver.  RISKS AND COMPLICATIONS  Take your time so you do not get dizzy or light-headed.  If you are in pain, you may need to take or  ask for pain medication before doing incentive spirometry. It is harder to take a deep breath if you are having pain. AFTER USE  Rest and breathe slowly and easily.  It can be helpful to keep track of a log of your progress. Your caregiver can provide you with a simple table to help with this. If you are using the spirometer at home, follow these instructions: [place] IF:   You are having difficultly using the spirometer.  You have trouble using the spirometer as often as instructed.  Your pain medication is not giving enough relief while using the spirometer.  You develop fever of 100.5 F (38.1 C) or higher. SEEK IMMEDIATE MEDICAL CARE IF:   You cough up bloody sputum that had not been present before.  You develop fever of 102 F (38.9 C) or greater.  You develop worsening pain at or near the incision site. MAKE SURE YOU:   Understand these instructions.  Will watch your condition.  Will get help right away if you are not doing well or get worse. Document Released: 04/05/2007 Document Revised: 02/15/2012 Document Reviewed: 06/06/2007 ExitCare Patient Information 2014 ExitCare, [place].   ________________________________________________________________________  WHAT IS A BLOOD TRANSFUSION? Blood Transfusion Information  A transfusion is the replacement of blood or some of its parts. Blood is made up of multiple cells which provide different functions.  Red blood cells carry oxygen and are used for blood loss replacement.  White blood cells fight against infection.  Platelets control bleeding.  Plasma helps clot blood.  Other blood products are available for specialized needs, such as hemophilia or other clotting disorders. BEFORE THE TRANSFUSION  Who gives blood for transfusions?   Healthy volunteers who are fully evaluated to make sure their blood is safe. This is blood bank blood. Transfusion therapy is the safest it has ever been in the practice of  medicine. Before blood is taken from a donor, a complete history is taken to make sure that person has no history of diseases nor engages in risky social behavior (examples are intravenous drug use or sexual activity with multiple partners). The donor's travel history is screened to minimize risk of transmitting infections, such as malaria.  The donated blood is tested for signs of infectious diseases, such as HIV and hepatitis. The blood is then tested to be sure it is compatible with you in order to minimize the chance of a transfusion reaction. If you or a relative donates blood, this is often done in anticipation of surgery and is not appropriate for emergency situations. It takes many days to process the donated blood. RISKS AND COMPLICATIONS Although transfusion therapy is very safe and saves many lives, the main dangers of transfusion include:   Getting an infectious disease.  Developing a transfusion reaction. This is an allergic reaction to something in the blood you were given. Every precaution is taken to prevent this. The decision to have a blood transfusion has been considered carefully by your caregiver before blood is given. Blood is not given unless the benefits outweigh the risks. AFTER THE TRANSFUSION  Right after receiving a blood transfusion, you will usually feel much better and more energetic. This is especially true if your red blood cells have gotten low (anemic). The transfusion raises the level of the red blood cells which carry oxygen, and this usually causes an energy increase.  The nurse administering the transfusion will monitor you carefully for complications. HOME CARE INSTRUCTIONS  No special instructions are needed after a transfusion. You may find your energy is better. Speak with your caregiver about any limitations on activity for underlying diseases you may have. SEEK MEDICAL CARE IF:   Your condition is not improving after your transfusion.  You develop  redness or irritation at the intravenous (IV) site. SEEK IMMEDIATE MEDICAL CARE IF:  Any of the following symptoms occur over the next 12 hours:  Shaking chills.  You have a temperature by mouth above 102 F (38.9 C), not controlled by medicine.  Chest, back, or muscle pain.  People around you feel you are not acting correctly or are confused.  Shortness of breath or difficulty breathing.  Dizziness and fainting.  You get a rash or develop hives.  You have a decrease in urine output.  Your urine turns a dark color or changes to pink, red, or brown. Any of the following symptoms occur over the next 10 days:  You have a temperature by mouth above 102 F (38.9 C), not controlled by medicine.  Shortness of breath.  Weakness after normal activity.  The white part of the eye turns yellow (jaundice).  You have a decrease in the amount of urine or are urinating less often.  Your urine turns a dark color or changes to pink, red, or brown. Document Released: 11/20/2000 Document Revised: 02/15/2012 Document Reviewed: 07/09/2008 [facility] Patient Information 2014 [place], [place].  _______________________________________________________________________

## 2019-09-18 DIAGNOSIS — I10 Essential (primary) hypertension: Secondary | ICD-10-CM | POA: Diagnosis not present

## 2019-09-18 DIAGNOSIS — Z01818 Encounter for other preprocedural examination: Secondary | ICD-10-CM | POA: Diagnosis not present

## 2019-09-18 DIAGNOSIS — M179 Osteoarthritis of knee, unspecified: Secondary | ICD-10-CM | POA: Diagnosis not present

## 2019-09-18 DIAGNOSIS — M25569 Pain in unspecified knee: Secondary | ICD-10-CM | POA: Diagnosis not present

## 2019-09-19 ENCOUNTER — Encounter (HOSPITAL_COMMUNITY): Payer: Self-pay

## 2019-09-19 ENCOUNTER — Other Ambulatory Visit: Payer: Self-pay

## 2019-09-19 ENCOUNTER — Encounter (HOSPITAL_COMMUNITY)
Admission: RE | Admit: 2019-09-19 | Discharge: 2019-09-19 | Disposition: A | Discharge status: Home / Self Care | Payer: PPO | Source: Ambulatory Visit | Attending: Orthopedic Surgery | Admitting: Orthopedic Surgery

## 2019-09-19 DIAGNOSIS — Z79899 Other long term (current) drug therapy: Secondary | ICD-10-CM | POA: Diagnosis not present

## 2019-09-19 DIAGNOSIS — K219 Gastro-esophageal reflux disease without esophagitis: Secondary | ICD-10-CM | POA: Insufficient documentation

## 2019-09-19 DIAGNOSIS — M1711 Unilateral primary osteoarthritis, right knee: Secondary | ICD-10-CM | POA: Insufficient documentation

## 2019-09-19 DIAGNOSIS — Z7982 Long term (current) use of aspirin: Secondary | ICD-10-CM | POA: Insufficient documentation

## 2019-09-19 DIAGNOSIS — I10 Essential (primary) hypertension: Secondary | ICD-10-CM | POA: Insufficient documentation

## 2019-09-19 DIAGNOSIS — Z01812 Encounter for preprocedural laboratory examination: Secondary | ICD-10-CM | POA: Insufficient documentation

## 2019-09-19 HISTORY — DX: Anxiety disorder, unspecified: F41.9

## 2019-09-19 LAB — BASIC METABOLIC PANEL
Anion gap: 12 (ref 5–15)
BUN: 14 mg/dL (ref 8–23)
CO2: 26 mmol/L (ref 22–32)
Calcium: 9.6 mg/dL (ref 8.9–10.3)
Chloride: 104 mmol/L (ref 98–111)
Creatinine, Ser: 0.64 mg/dL (ref 0.44–1.00)
GFR calc Af Amer: >60 mL/min (ref >60)
GFR calc non Af Amer: >60 mL/min (ref >60)
Glucose, Bld: 92 mg/dL (ref 70–99)
Potassium: 3.8 mmol/L (ref 3.5–5.1)
Sodium: 142 mmol/L (ref 135–145)

## 2019-09-19 LAB — SURGICAL PCR SCREEN
MRSA, PCR: NEGATIVE
Staphylococcus aureus: NEGATIVE

## 2019-09-19 LAB — CBC
HCT: 43.7 % (ref 36.0–46.0)
Hemoglobin: 13.9 g/dL (ref 12.0–15.0)
MCH: 28.9 pg (ref 26.0–34.0)
MCHC: 31.8 g/dL (ref 30.0–36.0)
MCV: 90.9 fL (ref 80.0–100.0)
Platelets: 308 10*3/uL (ref 150–400)
RBC: 4.81 MIL/uL (ref 3.87–5.11)
RDW: 12.9 % (ref 11.5–15.5)
WBC: 7.4 10*3/uL (ref 4.0–10.5)
nRBC: 0 % (ref 0.0–0.2)

## 2019-09-19 NOTE — Progress Notes (Signed)
   09/19/19 0932  OBSTRUCTIVE SLEEP APNEA  Have you ever been diagnosed with sleep apnea through a sleep study? No  Do you snore loudly (loud enough to be heard through closed doors)?  1  Do you often feel tired, fatigued, or sleepy during the daytime (such as falling asleep during driving or talking to someone)? 1  Has anyone observed you stop breathing during your sleep? 1  Do you have, or are you being treated for high blood pressure? 1  BMI more than 35 kg/m2? 0  Age > 50 (1-yes) 1  Neck circumference greater than:Female 16 inches or larger, Female 17inches or larger? 0  Female Gender (Yes=1) 0  Obstructive Sleep Apnea Score 5

## 2019-09-19 NOTE — Progress Notes (Addendum)
PCP - Dr. Wenda Low Cardiologist - saw a Cardiologist 20 years ago for rapid heart beat-wore a holter monitor for a month-no issues, found a IRBBB  Chest x-ray -none  EKG - 09/18/2019 Dr. Denton Ar on chart Stress Test -none  ECHO - none Cardiac Cath - none  Sleep Study - none CPAP - none  Fasting Blood Sugar -none  Checks Blood Sugar _____ times a day  Blood Thinner Instructions:none Aspirin Instructions:none Last Dose:none  Anesthesia review:   Patient denies shortness of breath, fever, cough and chest pain at PAT appointment   Patient verbalized understanding of instructions that were given to them at the PAT appointment. Patient was also instructed that they will need to review over the PAT instructions again at home before surgery.

## 2019-09-21 DIAGNOSIS — I10 Essential (primary) hypertension: Secondary | ICD-10-CM | POA: Diagnosis not present

## 2019-09-21 DIAGNOSIS — M179 Osteoarthritis of knee, unspecified: Secondary | ICD-10-CM | POA: Diagnosis not present

## 2019-09-21 DIAGNOSIS — E7800 Pure hypercholesterolemia, unspecified: Secondary | ICD-10-CM | POA: Diagnosis not present

## 2019-09-21 DIAGNOSIS — F324 Major depressive disorder, single episode, in partial remission: Secondary | ICD-10-CM | POA: Diagnosis not present

## 2019-09-22 ENCOUNTER — Other Ambulatory Visit (HOSPITAL_COMMUNITY)
Admission: RE | Admit: 2019-09-22 | Discharge: 2019-09-22 | Disposition: A | Discharge status: Home / Self Care | Payer: PPO | Source: Ambulatory Visit | Attending: Orthopedic Surgery | Admitting: Orthopedic Surgery

## 2019-09-22 DIAGNOSIS — Z20828 Contact with and (suspected) exposure to other viral communicable diseases: Secondary | ICD-10-CM | POA: Diagnosis not present

## 2019-09-22 DIAGNOSIS — Z01812 Encounter for preprocedural laboratory examination: Secondary | ICD-10-CM | POA: Diagnosis not present

## 2019-09-23 LAB — NOVEL CORONAVIRUS, NAA (HOSP ORDER, SEND-OUT TO REF LAB; TAT 18-24 HRS): SARS-CoV-2, NAA: NOT DETECTED

## 2019-09-26 ENCOUNTER — Other Ambulatory Visit: Payer: Self-pay

## 2019-09-26 ENCOUNTER — Encounter (HOSPITAL_COMMUNITY)
Admission: RE | Disposition: A | Discharge status: Home / Self Care | Payer: Self-pay | Source: Other Acute Inpatient Hospital | Attending: Orthopedic Surgery

## 2019-09-26 ENCOUNTER — Ambulatory Visit (HOSPITAL_COMMUNITY): Payer: PPO | Admitting: Anesthesiology

## 2019-09-26 ENCOUNTER — Encounter (HOSPITAL_COMMUNITY): Payer: Self-pay | Admitting: Emergency Medicine

## 2019-09-26 ENCOUNTER — Observation Stay (HOSPITAL_COMMUNITY)
Admission: RE | Admit: 2019-09-26 | Discharge: 2019-09-27 | Disposition: A | Discharge status: Home / Self Care | Payer: PPO | Source: Other Acute Inpatient Hospital | Attending: Orthopedic Surgery | Admitting: Orthopedic Surgery

## 2019-09-26 ENCOUNTER — Ambulatory Visit (HOSPITAL_COMMUNITY): Payer: PPO | Admitting: Physician Assistant

## 2019-09-26 DIAGNOSIS — M1711 Unilateral primary osteoarthritis, right knee: Secondary | ICD-10-CM | POA: Diagnosis not present

## 2019-09-26 DIAGNOSIS — Z7982 Long term (current) use of aspirin: Secondary | ICD-10-CM | POA: Insufficient documentation

## 2019-09-26 DIAGNOSIS — Z79899 Other long term (current) drug therapy: Secondary | ICD-10-CM | POA: Diagnosis not present

## 2019-09-26 DIAGNOSIS — E7800 Pure hypercholesterolemia, unspecified: Secondary | ICD-10-CM | POA: Diagnosis not present

## 2019-09-26 DIAGNOSIS — E669 Obesity, unspecified: Secondary | ICD-10-CM | POA: Insufficient documentation

## 2019-09-26 DIAGNOSIS — I10 Essential (primary) hypertension: Secondary | ICD-10-CM | POA: Diagnosis not present

## 2019-09-26 DIAGNOSIS — J45909 Unspecified asthma, uncomplicated: Secondary | ICD-10-CM | POA: Insufficient documentation

## 2019-09-26 DIAGNOSIS — Z6832 Body mass index (BMI) 32.0-32.9, adult: Secondary | ICD-10-CM | POA: Diagnosis not present

## 2019-09-26 DIAGNOSIS — M659 Synovitis and tenosynovitis, unspecified: Secondary | ICD-10-CM | POA: Insufficient documentation

## 2019-09-26 DIAGNOSIS — Z96651 Presence of right artificial knee joint: Secondary | ICD-10-CM

## 2019-09-26 DIAGNOSIS — Z96641 Presence of right artificial hip joint: Secondary | ICD-10-CM | POA: Diagnosis not present

## 2019-09-26 DIAGNOSIS — M238X1 Other internal derangements of right knee: Secondary | ICD-10-CM | POA: Insufficient documentation

## 2019-09-26 DIAGNOSIS — M25461 Effusion, right knee: Secondary | ICD-10-CM | POA: Insufficient documentation

## 2019-09-26 DIAGNOSIS — G8918 Other acute postprocedural pain: Secondary | ICD-10-CM | POA: Diagnosis not present

## 2019-09-26 DIAGNOSIS — K219 Gastro-esophageal reflux disease without esophagitis: Secondary | ICD-10-CM | POA: Diagnosis not present

## 2019-09-26 DIAGNOSIS — M25761 Osteophyte, right knee: Secondary | ICD-10-CM | POA: Diagnosis not present

## 2019-09-26 DIAGNOSIS — Z87891 Personal history of nicotine dependence: Secondary | ICD-10-CM | POA: Diagnosis not present

## 2019-09-26 HISTORY — PX: TOTAL KNEE ARTHROPLASTY: SHX125

## 2019-09-26 LAB — TYPE AND SCREEN
ABO/RH(D): A POS
Antibody Screen: NEGATIVE

## 2019-09-26 SURGERY — ARTHROPLASTY, KNEE, TOTAL
Anesthesia: Spinal | Site: Knee | Laterality: Right

## 2019-09-26 MED ORDER — STERILE WATER FOR IRRIGATION IR SOLN
Status: DC | PRN
  Administered 2019-09-26: 2000 mL

## 2019-09-26 MED ORDER — MENTHOL 3 MG MT LOZG: 1.0000 | LOZENGE | OROMUCOSAL | Status: DC | PRN

## 2019-09-26 MED ORDER — SODIUM CHLORIDE 0.9 % IV SOLN
INTRAVENOUS | Status: DC
  Administered 2019-09-26: 18:00:00 via INTRAVENOUS

## 2019-09-26 MED ORDER — ONDANSETRON HCL 4 MG/2ML IJ SOLN
INTRAMUSCULAR | Status: AC
  Filled 2019-09-26: qty 2

## 2019-09-26 MED ORDER — KETOROLAC TROMETHAMINE 30 MG/ML IJ SOLN
INTRAMUSCULAR | Status: DC | PRN
  Administered 2019-09-26: 30 mg via INTRA_ARTICULAR

## 2019-09-26 MED ORDER — MIDAZOLAM HCL 2 MG/2ML IJ SOLN
1.0000 mg | INTRAMUSCULAR | Status: DC
  Administered 2019-09-26: 1 mg via INTRAVENOUS
  Filled 2019-09-26: qty 2

## 2019-09-26 MED ORDER — METHOCARBAMOL 500 MG PO TABS
500.0000 mg | ORAL_TABLET | Freq: Four times a day (QID) | ORAL | Status: DC | PRN
  Administered 2019-09-26 – 2019-09-27 (×2): 500 mg via ORAL
  Filled 2019-09-26 (×2): qty 1

## 2019-09-26 MED ORDER — TRANEXAMIC ACID-NACL 1000-0.7 MG/100ML-% IV SOLN
1000.0000 mg | INTRAVENOUS | Status: AC
  Administered 2019-09-26: 1000 mg via INTRAVENOUS
  Filled 2019-09-26: qty 100

## 2019-09-26 MED ORDER — NON FORMULARY: 40.0000 mg | Freq: Every day | Status: DC

## 2019-09-26 MED ORDER — ONDANSETRON HCL 4 MG/2ML IJ SOLN: 4.0000 mg | Freq: Four times a day (QID) | INTRAMUSCULAR | Status: DC | PRN

## 2019-09-26 MED ORDER — HYDROMORPHONE HCL 2 MG PO TABS
2.0000 mg | ORAL_TABLET | ORAL | Status: DC | PRN
  Administered 2019-09-26: 4 mg via ORAL
  Administered 2019-09-27 (×2): 2 mg via ORAL
  Administered 2019-09-27: 4 mg via ORAL
  Filled 2019-09-26 (×2): qty 2
  Filled 2019-09-26 (×2): qty 1

## 2019-09-26 MED ORDER — DIPHENHYDRAMINE HCL 12.5 MG/5ML PO ELIX: 12.5000 mg | ORAL_SOLUTION | ORAL | Status: DC | PRN

## 2019-09-26 MED ORDER — SODIUM CHLORIDE (PF) 0.9 % IJ SOLN
INTRAMUSCULAR | Status: DC | PRN
  Administered 2019-09-26: 30 mL

## 2019-09-26 MED ORDER — SODIUM CHLORIDE 0.9 % IR SOLN
Status: DC | PRN
  Administered 2019-09-26: 1000 mL

## 2019-09-26 MED ORDER — EPHEDRINE SULFATE 50 MG/ML IJ SOLN
INTRAMUSCULAR | Status: DC | PRN
  Administered 2019-09-26 (×2): 10 mg via INTRAVENOUS
  Administered 2019-09-26: 5 mg via INTRAVENOUS
  Administered 2019-09-26 (×2): 10 mg via INTRAVENOUS

## 2019-09-26 MED ORDER — DEXAMETHASONE SODIUM PHOSPHATE 10 MG/ML IJ SOLN
INTRAMUSCULAR | Status: AC
  Filled 2019-09-26: qty 1

## 2019-09-26 MED ORDER — PROPOFOL 500 MG/50ML IV EMUL
INTRAVENOUS | Status: DC | PRN
  Administered 2019-09-26 (×2): 75 ug/kg/min via INTRAVENOUS

## 2019-09-26 MED ORDER — EPHEDRINE 5 MG/ML INJ
INTRAVENOUS | Status: AC
  Filled 2019-09-26: qty 10

## 2019-09-26 MED ORDER — ONDANSETRON HCL 4 MG PO TABS: 4.0000 mg | ORAL_TABLET | Freq: Four times a day (QID) | ORAL | Status: DC | PRN

## 2019-09-26 MED ORDER — BUPIVACAINE HCL (PF) 0.25 % IJ SOLN
INTRAMUSCULAR | Status: DC | PRN
  Administered 2019-09-26: 30 mL

## 2019-09-26 MED ORDER — METOCLOPRAMIDE HCL 5 MG/ML IJ SOLN: 5.0000 mg | Freq: Three times a day (TID) | INTRAMUSCULAR | Status: DC | PRN

## 2019-09-26 MED ORDER — ACETAMINOPHEN 500 MG PO TABS
1000.0000 mg | ORAL_TABLET | Freq: Four times a day (QID) | ORAL | Status: AC
  Administered 2019-09-26 – 2019-09-27 (×4): 1000 mg via ORAL
  Filled 2019-09-26 (×4): qty 2

## 2019-09-26 MED ORDER — PHENOL 1.4 % MT LIQD: 1.0000 | OROMUCOSAL | Status: DC | PRN

## 2019-09-26 MED ORDER — DULOXETINE HCL 60 MG PO CPEP
60.0000 mg | ORAL_CAPSULE | Freq: Every day | ORAL | Status: DC
  Administered 2019-09-27: 60 mg via ORAL
  Filled 2019-09-26: qty 1

## 2019-09-26 MED ORDER — HYDROMORPHONE HCL 1 MG/ML IJ SOLN
0.5000 mg | INTRAMUSCULAR | Status: DC | PRN
  Administered 2019-09-26 (×2): 1 mg via INTRAVENOUS
  Filled 2019-09-26 (×2): qty 1

## 2019-09-26 MED ORDER — POVIDONE-IODINE 10 % EX SWAB
2.0000 "application " | Freq: Once | CUTANEOUS | Status: AC
  Administered 2019-09-26: 2 via TOPICAL

## 2019-09-26 MED ORDER — METOCLOPRAMIDE HCL 5 MG PO TABS: 5.0000 mg | ORAL_TABLET | Freq: Three times a day (TID) | ORAL | Status: DC | PRN

## 2019-09-26 MED ORDER — METHOCARBAMOL 500 MG IVPB - SIMPLE MED
INTRAVENOUS | Status: AC
  Filled 2019-09-26: qty 50

## 2019-09-26 MED ORDER — PROPOFOL 500 MG/50ML IV EMUL
INTRAVENOUS | Status: DC | PRN
  Administered 2019-09-26: 20 mg via INTRAVENOUS
  Administered 2019-09-26: 10 mg via INTRAVENOUS

## 2019-09-26 MED ORDER — CHLORHEXIDINE GLUCONATE 4 % EX LIQD: 60.0000 mL | Freq: Once | CUTANEOUS | Status: DC

## 2019-09-26 MED ORDER — ONDANSETRON HCL 4 MG/2ML IJ SOLN
INTRAMUSCULAR | Status: DC | PRN
  Administered 2019-09-26: 4 mg via INTRAVENOUS

## 2019-09-26 MED ORDER — PROPOFOL 10 MG/ML IV BOLUS
INTRAVENOUS | Status: AC
  Filled 2019-09-26: qty 20

## 2019-09-26 MED ORDER — ROPIVACAINE HCL 7.5 MG/ML IJ SOLN
INTRAMUSCULAR | Status: DC | PRN
  Administered 2019-09-26: 20 mL via PERINEURAL

## 2019-09-26 MED ORDER — FENTANYL CITRATE (PF) 100 MCG/2ML IJ SOLN
50.0000 ug | INTRAMUSCULAR | Status: DC
  Administered 2019-09-26: 50 ug via INTRAVENOUS
  Filled 2019-09-26: qty 2

## 2019-09-26 MED ORDER — SODIUM CHLORIDE (PF) 0.9 % IJ SOLN
INTRAMUSCULAR | Status: AC
  Filled 2019-09-26: qty 50

## 2019-09-26 MED ORDER — PROPOFOL 10 MG/ML IV BOLUS
INTRAVENOUS | Status: AC
  Filled 2019-09-26: qty 40

## 2019-09-26 MED ORDER — BUPIVACAINE HCL (PF) 0.25 % IJ SOLN
INTRAMUSCULAR | Status: AC
  Filled 2019-09-26: qty 30

## 2019-09-26 MED ORDER — TRANEXAMIC ACID-NACL 1000-0.7 MG/100ML-% IV SOLN
1000.0000 mg | Freq: Once | INTRAVENOUS | Status: AC
  Administered 2019-09-26: 1000 mg via INTRAVENOUS
  Filled 2019-09-26: qty 100

## 2019-09-26 MED ORDER — DILTIAZEM HCL ER COATED BEADS 180 MG PO CP24
180.0000 mg | ORAL_CAPSULE | Freq: Every day | ORAL | Status: DC
  Administered 2019-09-27: 180 mg via ORAL
  Filled 2019-09-26: qty 1

## 2019-09-26 MED ORDER — CEFAZOLIN SODIUM-DEXTROSE 2-4 GM/100ML-% IV SOLN
2.0000 g | INTRAVENOUS | Status: AC
  Administered 2019-09-26: 2 g via INTRAVENOUS
  Filled 2019-09-26: qty 100

## 2019-09-26 MED ORDER — DOCUSATE SODIUM 100 MG PO CAPS
100.0000 mg | ORAL_CAPSULE | Freq: Two times a day (BID) | ORAL | Status: DC
  Administered 2019-09-26 – 2019-09-27 (×2): 100 mg via ORAL
  Filled 2019-09-26 (×2): qty 1

## 2019-09-26 MED ORDER — ALUM & MAG HYDROXIDE-SIMETH 200-200-20 MG/5ML PO SUSP: 15.0000 mL | ORAL | Status: DC | PRN

## 2019-09-26 MED ORDER — 0.9 % SODIUM CHLORIDE (POUR BTL) OPTIME
TOPICAL | Status: DC | PRN
  Administered 2019-09-26: 1000 mL

## 2019-09-26 MED ORDER — DEXAMETHASONE SODIUM PHOSPHATE 10 MG/ML IJ SOLN
10.0000 mg | Freq: Once | INTRAMUSCULAR | Status: AC
  Administered 2019-09-27: 10 mg via INTRAVENOUS
  Filled 2019-09-26: qty 1

## 2019-09-26 MED ORDER — METHOCARBAMOL 500 MG IVPB - SIMPLE MED
500.0000 mg | Freq: Four times a day (QID) | INTRAVENOUS | Status: DC | PRN
  Administered 2019-09-26: 500 mg via INTRAVENOUS
  Filled 2019-09-26: qty 50

## 2019-09-26 MED ORDER — ALPRAZOLAM 0.25 MG PO TABS: 0.2500 mg | ORAL_TABLET | Freq: Every day | ORAL | Status: DC | PRN

## 2019-09-26 MED ORDER — BUPIVACAINE IN DEXTROSE 0.75-8.25 % IT SOLN
INTRATHECAL | Status: DC | PRN
  Administered 2019-09-26: 1.6 mL via INTRATHECAL

## 2019-09-26 MED ORDER — LACTATED RINGERS IV SOLN
INTRAVENOUS | Status: DC
  Administered 2019-09-26 (×3): via INTRAVENOUS

## 2019-09-26 MED ORDER — ALBUTEROL SULFATE (2.5 MG/3ML) 0.083% IN NEBU: 2.5000 mg | INHALATION_SOLUTION | Freq: Four times a day (QID) | RESPIRATORY_TRACT | Status: DC | PRN

## 2019-09-26 MED ORDER — SIMVASTATIN 20 MG PO TABS
20.0000 mg | ORAL_TABLET | Freq: Every evening | ORAL | Status: DC
  Administered 2019-09-26: 20 mg via ORAL
  Filled 2019-09-26: qty 1

## 2019-09-26 MED ORDER — ASPIRIN 81 MG PO CHEW
81.0000 mg | CHEWABLE_TABLET | Freq: Two times a day (BID) | ORAL | Status: DC
  Administered 2019-09-26 – 2019-09-27 (×2): 81 mg via ORAL
  Filled 2019-09-26 (×2): qty 1

## 2019-09-26 MED ORDER — BISACODYL 10 MG RE SUPP: 10.0000 mg | Freq: Every day | RECTAL | Status: DC | PRN

## 2019-09-26 MED ORDER — PHENYLEPHRINE HCL (PRESSORS) 10 MG/ML IV SOLN
INTRAVENOUS | Status: DC | PRN
  Administered 2019-09-26 (×3): 80 ug via INTRAVENOUS

## 2019-09-26 MED ORDER — POLYETHYLENE GLYCOL 3350 17 G PO PACK
17.0000 g | PACK | Freq: Two times a day (BID) | ORAL | Status: DC
  Administered 2019-09-27: 17 g via ORAL
  Filled 2019-09-26: qty 1

## 2019-09-26 MED ORDER — FERROUS SULFATE 325 (65 FE) MG PO TABS
325.0000 mg | ORAL_TABLET | Freq: Two times a day (BID) | ORAL | Status: DC
  Administered 2019-09-27: 325 mg via ORAL
  Filled 2019-09-26 (×2): qty 1

## 2019-09-26 MED ORDER — MAGNESIUM CITRATE PO SOLN: 1.0000 | Freq: Once | ORAL | Status: DC | PRN

## 2019-09-26 MED ORDER — PHENYLEPHRINE 40 MCG/ML (10ML) SYRINGE FOR IV PUSH (FOR BLOOD PRESSURE SUPPORT)
PREFILLED_SYRINGE | INTRAVENOUS | Status: AC
  Filled 2019-09-26: qty 10

## 2019-09-26 MED ORDER — CEFAZOLIN SODIUM-DEXTROSE 2-4 GM/100ML-% IV SOLN
2.0000 g | Freq: Four times a day (QID) | INTRAVENOUS | Status: AC
  Administered 2019-09-26 (×2): 2 g via INTRAVENOUS
  Filled 2019-09-26 (×2): qty 100

## 2019-09-26 MED ORDER — OMEPRAZOLE 20 MG PO CPDR
40.0000 mg | DELAYED_RELEASE_CAPSULE | Freq: Every day | ORAL | Status: DC
  Administered 2019-09-27: 40 mg via ORAL
  Filled 2019-09-26: qty 2

## 2019-09-26 MED ORDER — DEXAMETHASONE SODIUM PHOSPHATE 10 MG/ML IJ SOLN
10.0000 mg | Freq: Once | INTRAMUSCULAR | Status: AC
  Administered 2019-09-26: 10:00:00 10 mg via INTRAVENOUS

## 2019-09-26 MED ORDER — KETOROLAC TROMETHAMINE 30 MG/ML IJ SOLN
INTRAMUSCULAR | Status: AC
  Filled 2019-09-26: qty 1

## 2019-09-26 SURGICAL SUPPLY — 67 items
ADH SKN CLS APL DERMABOND .7 (GAUZE/BANDAGES/DRESSINGS) ×1
ATTUNE MED ANAT PAT 35 KNEE (Knees) ×1 IMPLANT
ATTUNE MED ANAT PAT 35MM KNEE (Knees) ×1 IMPLANT
ATTUNE PSFEM RTSZ5 NARCEM KNEE (Femur) ×2 IMPLANT
ATTUNE PSRP INSR SZ5 8 KNEE (Insert) ×1 IMPLANT
ATTUNE PSRP INSR SZ5 8MM KNEE (Insert) ×1 IMPLANT
BAG SPEC THK2 15X12 ZIP CLS (MISCELLANEOUS)
BAG ZIPLOCK 12X15 (MISCELLANEOUS) IMPLANT
BASE TIBIAL ROT PLAT SZ 5 KNEE (Knees) IMPLANT
BLADE SAW SGTL 11.0X1.19X90.0M (BLADE) IMPLANT
BLADE SAW SGTL 13.0X1.19X90.0M (BLADE) ×3 IMPLANT
BLADE SURG SZ10 CARB STEEL (BLADE) ×6 IMPLANT
BNDG CMPR MED 10X6 ELC LF (GAUZE/BANDAGES/DRESSINGS) ×1
BNDG ELASTIC 6X10 VLCR STRL LF (GAUZE/BANDAGES/DRESSINGS) ×2 IMPLANT
BNDG ELASTIC 6X5.8 VLCR STR LF (GAUZE/BANDAGES/DRESSINGS) ×3 IMPLANT
BOWL SMART MIX CTS (DISPOSABLE) ×3 IMPLANT
BSPLAT TIB 5 CMNT ROT PLAT STR (Knees) ×1 IMPLANT
CEMENT HV SMART SET (Cement) ×4 IMPLANT
COVER SURGICAL LIGHT HANDLE (MISCELLANEOUS) ×3 IMPLANT
COVER WAND RF STERILE (DRAPES) IMPLANT
CUFF TOURN SGL QUICK 34 (TOURNIQUET CUFF) ×3
CUFF TRNQT CYL 34X4.125X (TOURNIQUET CUFF) ×1 IMPLANT
DECANTER SPIKE VIAL GLASS SM (MISCELLANEOUS) ×6 IMPLANT
DERMABOND ADVANCED (GAUZE/BANDAGES/DRESSINGS) ×2
DERMABOND ADVANCED .7 DNX12 (GAUZE/BANDAGES/DRESSINGS) ×1 IMPLANT
DRAPE U-SHAPE 47X51 STRL (DRAPES) ×3 IMPLANT
DRESSING AQUACEL AG SP 3.5X10 (GAUZE/BANDAGES/DRESSINGS) ×1 IMPLANT
DRSG AQUACEL AG ADV 3.5X10 (GAUZE/BANDAGES/DRESSINGS) ×2 IMPLANT
DRSG AQUACEL AG SP 3.5X10 (GAUZE/BANDAGES/DRESSINGS) ×3
DURAPREP 26ML APPLICATOR (WOUND CARE) ×6 IMPLANT
ELECT REM PT RETURN 15FT ADLT (MISCELLANEOUS) ×3 IMPLANT
GLOVE BIO SURGEON STRL SZ 6 (GLOVE) ×3 IMPLANT
GLOVE BIOGEL PI IND STRL 6.5 (GLOVE) ×1 IMPLANT
GLOVE BIOGEL PI IND STRL 7.5 (GLOVE) ×1 IMPLANT
GLOVE BIOGEL PI IND STRL 8.5 (GLOVE) ×1 IMPLANT
GLOVE BIOGEL PI INDICATOR 6.5 (GLOVE) ×2
GLOVE BIOGEL PI INDICATOR 7.5 (GLOVE) ×2
GLOVE BIOGEL PI INDICATOR 8.5 (GLOVE) ×2
GLOVE ECLIPSE 8.0 STRL XLNG CF (GLOVE) ×3 IMPLANT
GLOVE ORTHO TXT STRL SZ7.5 (GLOVE) ×3 IMPLANT
GOWN STRL REUS W/ TWL LRG LVL3 (GOWN DISPOSABLE) ×1 IMPLANT
GOWN STRL REUS W/TWL 2XL LVL3 (GOWN DISPOSABLE) ×3 IMPLANT
GOWN STRL REUS W/TWL LRG LVL3 (GOWN DISPOSABLE) ×6 IMPLANT
HANDPIECE INTERPULSE COAX TIP (DISPOSABLE) ×3
HOLDER FOLEY CATH W/STRAP (MISCELLANEOUS) ×2 IMPLANT
KIT TURNOVER KIT A (KITS) ×2 IMPLANT
MANIFOLD NEPTUNE II (INSTRUMENTS) ×3 IMPLANT
NDL SAFETY ECLIPSE 18X1.5 (NEEDLE) IMPLANT
NEEDLE HYPO 18GX1.5 SHARP (NEEDLE) ×3
NS IRRIG 1000ML POUR BTL (IV SOLUTION) ×3 IMPLANT
PACK TOTAL KNEE CUSTOM (KITS) ×3 IMPLANT
PIN DRILL FIX HALF THREAD (BIT) ×2 IMPLANT
PIN FIX SIGMA LCS THRD HI (PIN) ×2 IMPLANT
PROTECTOR NERVE ULNAR (MISCELLANEOUS) ×3 IMPLANT
SET HNDPC FAN SPRY TIP SCT (DISPOSABLE) ×1 IMPLANT
SET PAD KNEE POSITIONER (MISCELLANEOUS) ×3 IMPLANT
SUT MNCRL AB 4-0 PS2 18 (SUTURE) ×3 IMPLANT
SUT STRATAFIX PDS+ 0 24IN (SUTURE) ×3 IMPLANT
SUT VIC AB 1 CT1 36 (SUTURE) ×3 IMPLANT
SUT VIC AB 2-0 CT1 27 (SUTURE) ×9
SUT VIC AB 2-0 CT1 TAPERPNT 27 (SUTURE) ×3 IMPLANT
SYR 3ML LL SCALE MARK (SYRINGE) ×3 IMPLANT
TIBIAL BASE ROT PLAT SZ 5 KNEE (Knees) ×3 IMPLANT
TRAY FOLEY MTR SLVR 16FR STAT (SET/KITS/TRAYS/PACK) ×3 IMPLANT
WATER STERILE IRR 1000ML POUR (IV SOLUTION) ×6 IMPLANT
WRAP KNEE MAXI GEL POST OP (GAUZE/BANDAGES/DRESSINGS) ×3 IMPLANT
YANKAUER SUCT BULB TIP 10FT TU (MISCELLANEOUS) ×3 IMPLANT

## 2019-09-26 NOTE — Interval H&P Note (Signed)
History and Physical Interval Note:  09/26/2019 8:52 AM  [name]  has presented today for surgery, with the diagnosis of Right knee osteoarthritis.  The various methods of treatment have been discussed with the patient and family. After consideration of risks, benefits and other options for treatment, the patient has consented to  Procedure(s) with comments: TOTAL KNEE ARTHROPLASTY (Right) - 70 mins as a surgical intervention.  The patient's history has been reviewed, patient examined, no change in status, stable for surgery.  I have reviewed the patient's chart and labs.  Questions were answered to the patient's satisfaction.     Mauri Pole

## 2019-09-26 NOTE — Anesthesia Procedure Notes (Signed)
Anesthesia Regional Block: Adductor canal block   Pre-Anesthetic Checklist: ,, timeout performed, Correct Patient, Correct Site, Correct Laterality, Correct Procedure, Correct Position, site marked, Risks and benefits discussed,  Surgical consent,  Pre-op evaluation,  At surgeon's request and post-op pain management  Laterality: Right  Prep: chloraprep       Needles:  Injection technique: Single-shot  Needle Type: Echogenic Needle     Needle Length: 10cm  Needle Gauge: 21     Additional Needles:   Narrative:  Start time: 09/26/2019 9:05 AM End time: 09/26/2019 9:08 AM Injection made incrementally with aspirations every 5 mL.  Performed by: Personally  Anesthesiologist: Audry Pili, MD  Additional Notes: No pain on injection. No increased resistance to injection. Injection made in 5cc increments. Good needle visualization. Patient tolerated the procedure well.

## 2019-09-26 NOTE — Plan of Care (Signed)
NA

## 2019-09-26 NOTE — Progress Notes (Signed)
Assisted Dr. Brock with right, ultrasound guided, adductor canal block. Side rails up, monitors on throughout procedure. See vital signs in flow sheet. Tolerated Procedure well.  

## 2019-09-26 NOTE — Op Note (Signed)
NAME:  ELLAKATE BAYLIFF                      MEDICAL RECORD NO.:  [id number]                             FACILITY:  [facility]      PHYSICIAN:  Pietro Cassis. Alvan Dame, M.D.  DATE OF BIRTH:  January 28, 1951      DATE OF PROCEDURE:  09/26/2019                                     OPERATIVE REPORT         PREOPERATIVE DIAGNOSIS:  Right knee osteoarthritis.      POSTOPERATIVE DIAGNOSIS:  Right knee osteoarthritis.      FINDINGS:  The patient was noted to have complete loss of cartilage and   bone-on-bone arthritis with associated osteophytes in the medial and patellofemoral compartments of   the knee with a significant synovitis and associated effusion.  The patient had failed months of conservative treatment including medications, injection therapy, activity modification.     PROCEDURE:  Right total knee replacement.      COMPONENTS USED:  DePuy Attune rotating platform posterior stabilized knee   system, a size 5N femur, 5 tibia, size 8 mm PS AOX insert, and 35 anatomic patellar   button.      SURGEON:  Pietro Cassis. Alvan Dame, M.D.      ASSISTANT:  Danae Orleans, PA-C.      ANESTHESIA:  Regional and Spinal.      SPECIMENS:  None.      COMPLICATION:  None.      DRAINS:  None.  EBL: <100cc      TOURNIQUET TIME:   Total Tourniquet Time Documented: Thigh (Right) - 26 minutes Total: Thigh (Right) - 26 minutes  .      The patient was stable to the recovery room.      INDICATION FOR PROCEDURE:  ARILLA FLIS is a 68 y.o. female patient of   mine.  The patient had been seen, evaluated, and treated for months conservatively in the   office with medication, activity modification, and injections.  The patient had   radiographic changes of bone-on-bone arthritis with endplate sclerosis and osteophytes noted.  Based on the radiographic changes and failed conservative measures, the patient   decided to proceed with definitive treatment, total knee replacement.  Risks of infection, DVT, component failure,  need for revision surgery, neurovascular injury were reviewed in the office setting.  The postop course was reviewed stressing the efforts to maximize post-operative satisfaction and function.  Consent was obtained for benefit of pain   relief.      PROCEDURE IN DETAIL:  The patient was brought to the operative theater.   Once adequate anesthesia, preoperative antibiotics, 2 gm of Ancef,1 gm of Tranexamic Acid, and 10 mg of Decadron administered, the patient was positioned supine with a right thigh tourniquet placed.  The  right lower extremity was prepped and draped in sterile fashion.  A time-   out was performed identifying the patient, planned procedure, and the appropriate extremity.      The right lower extremity was placed in the [facility] leg holder.  The leg was   exsanguinated, tourniquet elevated to 250 mmHg.  A midline incision was   made  followed by median parapatellar arthrotomy.  Following initial   exposure, attention was first directed to the patella.  Precut   measurement was noted to be 22-23 mm.  I resected down to 14 mm and used a   35 anatomic patellar button to restore patellar height as well as cover the cut surface.      The lug holes were drilled and a metal shim was placed to protect the   patella from retractors and saw blade during the procedure.      At this point, attention was now directed to the femur.  The femoral   canal was opened with a drill, irrigated to try to prevent fat emboli.  An   intramedullary rod was passed at 3 degrees valgus, 9 mm of bone was   resected off the distal femur.  Following this resection, the tibia was   subluxated anteriorly.  Using the extramedullary guide, 2 mm of bone was resected off   the proximal medial tibia.  We confirmed the gap would be   stable medially and laterally with a size 6 spacer block as well as confirmed that the tibial cut was perpendicular in the coronal plane, checking with an alignment rod.      Once this  was done, I sized the femur to be a size 5 in the anterior-   posterior dimension, chose a narrow component based on medial and   lateral dimension.  The size 5 rotation block was then pinned in   position anterior referenced using the C-clamp to set rotation.  The   anterior, posterior, and  chamfer cuts were made without difficulty nor   notching making certain that I was along the anterior cortex to help   with flexion gap stability.      The final box cut was made off the lateral aspect of distal femur.      At this point, the tibia was sized to be a size 5.  The size 5 tray was   then pinned in position through the medial third of the tubercle,   drilled, and keel punched.  Trial reduction was now carried with a 5 femur,  5 tibia, a size 7 then 8 mm PS insert, and the 35 anatomic patella botton.  The knee was brought to full extension with good flexion stability with the patella   tracking through the trochlea without application of pressure.  Given   all these findings the trial components removed.  Final components were   opened and cement was mixed.  The knee was irrigated with normal saline solution and pulse lavage.  The synovial lining was   then injected with 30 cc of 0.25% Marcaine with epinephrine, 1 cc of Toradol and 30 cc of NS for a total of 61 cc.     Final implants were then cemented onto cleaned and dried cut surfaces of bone with the knee brought to extension with a size 8 mm PS trial insert.      Once the cement had fully cured, excess cement was removed   throughout the knee.  I confirmed that I was satisfied with the range of   motion and stability, and the final size 8 mm PS AOX insert was chosen.  It was   placed into the knee.      The tourniquet had been let down at 26 minutes.  No significant   hemostasis was required.  The extensor mechanism was then reapproximated using #1  Vicryl and #1 Stratafix sutures with the knee   in flexion.  The   remaining wound  was closed with 2-0 Vicryl and running 4-0 Monocryl.   The knee was cleaned, dried, dressed sterilely using Dermabond and   Aquacel dressing.  The patient was then   brought to recovery room in stable condition, tolerating the procedure   well.   Please note that Physician Assistant, Danae Orleans, PA-C was present for the entirety of the case, and was utilized for pre-operative positioning, peri-operative retractor management, general facilitation of the procedure and for primary wound closure at the end of the case.              Pietro Cassis Alvan Dame, M.D.    09/26/2019 11:33 AM

## 2019-09-26 NOTE — Evaluation (Signed)
Physical Therapy Evaluation Patient Details Name: [name] MRN: [id number] DOB: June 09, 1951 Today's Date: 09/26/2019   History of Present Illness  R TKA; PMH R AA-THA 2014  Clinical Impression  Pt is s/p TKA resulting in the deficits listed below (see PT Problem List). Pt ambulated 76' with RW, initiated TKA HEP. Good progress expected. Activity tolerance limited by 8/10 R knee pain.  Pt will benefit from skilled PT to increase their independence and safety with mobility to allow discharge to the venue listed below.      Follow Up Recommendations Follow surgeon's recommendation for DC plan and follow-up therapies    Equipment Recommendations  None recommended by PT    Recommendations for Other Services       Precautions / Restrictions Precautions Precautions: Fall Precaution Comments: h/o 1 fall in past year (flip flop caught on a step) Restrictions Weight Bearing Restrictions: No Other Position/Activity Restrictions: WBAT      Mobility  Bed Mobility Overal bed mobility: Needs Assistance Bed Mobility: Supine to Sit     Supine to sit: Min assist     General bed mobility comments: assist to support RLE  Transfers Overall transfer level: Needs assistance Equipment used: Rolling walker (2 wheeled) Transfers: Sit to/from Stand Sit to Stand: Min assist;From elevated surface         General transfer comment: VCs hand placement, min A to rise  Ambulation/Gait Ambulation/Gait assistance: Min guard Gait Distance (Feet): 38 Feet Assistive device: Rolling walker (2 wheeled) Gait Pattern/deviations: Step-to pattern;Decreased stance time - right;Decreased step length - left;Decreased step length - right Gait velocity: decr   General Gait Details: VCs sequencing, distance limited by pain  Stairs            Wheelchair Mobility    Modified Rankin (Stroke Patients Only)       Balance Overall balance assessment: Modified Independent                                            Pertinent Vitals/Pain Pain Assessment: 0-10 Pain Score: 8  Pain Location: R thigh Pain Descriptors / Indicators: Sore Pain Intervention(s): Limited activity within patient's tolerance;Monitored during session;Premedicated before session;Ice applied;Patient requesting pain meds-RN notified    Home Living Family/patient expects to be discharged to:: Private residence Living Arrangements: Spouse/significant other Available Help at Discharge: Family;Available 24 hours/day Type of Home: House Home Access: Ramped entrance     Home Layout: One level Home Equipment: Walker - 2 wheels;Bedside commode;Grab bars - tub/shower      Prior Function                 Hand Dominance        Extremity/Trunk Assessment   Upper Extremity Assessment Upper Extremity Assessment: Overall WFL for tasks assessed    Lower Extremity Assessment Lower Extremity Assessment: RLE deficits/detail RLE Deficits / Details: SLR -3/5, knee ext -3/5, knee AAROM 5-35* limited by pain RLE Sensation: WNL RLE Coordination: WNL    Cervical / Trunk Assessment Cervical / Trunk Assessment: Normal  Communication   Communication: No difficulties  Cognition Arousal/Alertness: Awake/alert Behavior During Therapy: WFL for tasks assessed/performed Overall Cognitive Status: Within Functional Limits for tasks assessed  General Comments      Exercises Total Joint Exercises Ankle Circles/Pumps: AROM;Both;10 reps;Supine Quad Sets: AROM;Right;5 reps;Supine Heel Slides: AAROM;Right;10 reps;Supine Long Arc Quad: AAROM;AROM;Right;5 reps;Seated   Assessment/Plan    PT Assessment Patient needs continued PT services  PT Problem List Decreased strength;Decreased range of motion;Decreased activity tolerance;Decreased mobility;Decreased knowledge of use of DME;Pain       PT Treatment Interventions DME instruction;Gait  training;Functional mobility training;Therapeutic activities;Therapeutic exercise;Patient/family education    PT Goals (Current goals can be found in the Care Plan section)  Acute Rehab PT Goals Patient Stated Goal: be able to play with grandkids PT Goal Formulation: With patient/family Time For Goal Achievement: 10/03/19 Potential to Achieve Goals: Good    Frequency 7X/week   Barriers to discharge        Co-evaluation               AM-PAC PT "6 Clicks" Mobility  Outcome Measure Help needed turning from your back to your side while in a flat bed without using bedrails?: A Little Help needed moving from lying on your back to sitting on the side of a flat bed without using bedrails?: A Little Help needed moving to and from a bed to a chair (including a wheelchair)?: A Little Help needed standing up from a chair using your arms (e.g., wheelchair or bedside chair)?: A Little Help needed to walk in hospital room?: A Little Help needed climbing 3-5 steps with a railing? : A Lot 6 Click Score: 17    End of Session Equipment Utilized During Treatment: Gait belt Activity Tolerance: Patient tolerated treatment well;Patient limited by pain Patient left: in chair;with call bell/phone within reach;with family/visitor present Nurse Communication: Mobility status PT Visit Diagnosis: Difficulty in walking, not elsewhere classified (R26.2);Muscle weakness (generalized) (M62.81);Pain Pain - Right/Left: Right Pain - part of body: Knee    Time: 1710-1737 PT Time Calculation (min) (ACUTE ONLY): 27 min   Charges:   PT Evaluation $PT Eval Low Complexity: 1 Low PT Treatments $Gait Training: 8-22 mins       Blondell Reveal Kistler PT 09/26/2019  Acute Rehabilitation Services Pager [phone] Office [phone]

## 2019-09-26 NOTE — Anesthesia Procedure Notes (Signed)
Date/Time: 09/26/2019 10:08 AM Performed by: Glory Buff, CRNA Oxygen Delivery Method: Nasal cannula

## 2019-09-26 NOTE — Discharge Instructions (Signed)

## 2019-09-26 NOTE — Anesthesia Preprocedure Evaluation (Addendum)
Anesthesia Evaluation  Patient identified by MRN, date of birth, ID band Patient awake    Reviewed: Allergy & Precautions, NPO status , Patient's Chart, lab work & pertinent test results  History of Anesthesia Complications Negative for: history of anesthetic complications  Airway Mallampati: II  TM Distance: >3 FB Neck ROM: Full    Dental  (+) Dental Advisory Given, Teeth Intact   Pulmonary asthma (very mild) , former smoker,    Pulmonary exam normal        Cardiovascular hypertension, Pt. on medications (-) anginaNormal cardiovascular exam     Neuro/Psych PSYCHIATRIC DISORDERS Anxiety negative neurological ROS     GI/Hepatic Neg liver ROS, GERD  Medicated and Controlled,  Endo/Other   Obesity   Renal/GU negative Renal ROS     Musculoskeletal  (+) Arthritis ,   Abdominal   Peds  Hematology negative hematology ROS (+)  Plt 308k    Anesthesia Other Findings   Reproductive/Obstetrics                            Anesthesia Physical Anesthesia Plan  ASA: II  Anesthesia Plan: Spinal   Post-op Pain Management:  Regional for Post-op pain   Induction:   PONV Risk Score and Plan: 2 and Treatment may vary due to age or medical condition and Propofol infusion  Airway Management Planned: Natural Airway and Simple Face Mask  Additional Equipment: None  Intra-op Plan:   Post-operative Plan:   Informed Consent: I have reviewed the patients History and Physical, chart, labs and discussed the procedure including the risks, benefits and alternatives for the proposed anesthesia with the patient or authorized representative who has indicated his/her understanding and acceptance.       Plan Discussed with: CRNA and Anesthesiologist  Anesthesia Plan Comments: (Labs reviewed, platelets acceptable. Discussed risks and benefits of spinal, including spinal/epidural hematoma, infection, failed  block, and PDPH. Patient expressed understanding and wished to proceed. )       Anesthesia Quick Evaluation

## 2019-09-26 NOTE — Transfer of Care (Signed)
Immediate Anesthesia Transfer of Care Note  Patient: [name]  Procedure(s) Performed: TOTAL KNEE ARTHROPLASTY (Right Knee)  Patient Location: PACU  Anesthesia Type:Spinal  Level of Consciousness: drowsy and patient cooperative  Airway & Oxygen Therapy: Patient Spontanous Breathing and Patient connected to nasal cannula oxygen  Post-op Assessment: Report given to RN and Post -op Vital signs reviewed and stable  Post vital signs: Reviewed and stable  Last Vitals:  Vitals Value Taken Time  BP 116/67 09/26/19 1158  Temp    Pulse 64 09/26/19 1200  Resp 16 09/26/19 1200  SpO2 100 % 09/26/19 1200  Vitals shown include unvalidated device data.  Last Pain:  Vitals:   09/26/19 0908  TempSrc:   PainSc: 0-No pain      Patients Stated Pain Goal: 4 ([id number])  Complications: No apparent anesthesia complications

## 2019-09-26 NOTE — Anesthesia Procedure Notes (Signed)
Spinal  Patient location during procedure: OR Start time: 09/26/2019 10:10 AM End time: 09/26/2019 10:14 AM Staffing Anesthesiologist: Audry Pili, MD Performed: anesthesiologist  Preanesthetic Checklist Completed: patient identified, surgical consent, pre-op evaluation, timeout performed, IV checked, risks and benefits discussed and monitors and equipment checked Spinal Block Patient position: sitting Prep: DuraPrep Patient monitoring: heart rate, cardiac monitor, continuous pulse ox and blood pressure Approach: midline Location: L3-4 Injection technique: single-shot Needle Needle type: Pencan  Needle gauge: 24 G Additional Notes Consent was obtained prior to the procedure with all questions answered and concerns addressed. Risks including, but not limited to, bleeding, infection, nerve damage, paralysis, failed block, inadequate analgesia, allergic reaction, high spinal, itching, and headache were discussed and the patient wished to proceed. Functioning IV was confirmed and monitors were applied. Sterile prep and drape, including hand hygiene, mask, and sterile gloves were used. The patient was positioned and the spine was prepped. The skin was anesthetized with lidocaine. Free flow of clear CSF was obtained prior to injecting local anesthetic into the CSF. The spinal needle aspirated freely following injection. The needle was carefully withdrawn. The patient tolerated the procedure well.   Renold Don, MD

## 2019-09-26 NOTE — Anesthesia Postprocedure Evaluation (Signed)
Anesthesia Post Note  Patient: [name]  Procedure(s) Performed: TOTAL KNEE ARTHROPLASTY (Right Knee)     Patient location during evaluation: PACU Anesthesia Type: Spinal Level of consciousness: awake and alert Pain management: pain level controlled Vital Signs Assessment: post-procedure vital signs reviewed and stable Respiratory status: spontaneous breathing and respiratory function stable Cardiovascular status: blood pressure returned to baseline and stable Postop Assessment: spinal receding and no apparent nausea or vomiting Anesthetic complications: no    Last Vitals:  Vitals:   09/26/19 1245 09/26/19 1300  BP: 127/64 130/65  Pulse: 66 (!) 59  Resp: 12 14  Temp:    SpO2: 99% 99%    Last Pain:  Vitals:   09/26/19 1230  TempSrc:   PainSc: 0-No pain                 Audry Pili

## 2019-09-27 ENCOUNTER — Encounter (HOSPITAL_COMMUNITY): Payer: Self-pay | Admitting: Orthopedic Surgery

## 2019-09-27 DIAGNOSIS — M1711 Unilateral primary osteoarthritis, right knee: Secondary | ICD-10-CM | POA: Diagnosis not present

## 2019-09-27 LAB — CBC
HCT: 32.3 % — ABNORMAL LOW (ref 36.0–46.0)
Hemoglobin: 10.1 g/dL — ABNORMAL LOW (ref 12.0–15.0)
MCH: 28.9 pg (ref 26.0–34.0)
MCHC: 31.3 g/dL (ref 30.0–36.0)
MCV: 92.3 fL (ref 80.0–100.0)
Platelets: 269 10*3/uL (ref 150–400)
RBC: 3.5 MIL/uL — ABNORMAL LOW (ref 3.87–5.11)
RDW: 13.1 % (ref 11.5–15.5)
WBC: 14.3 10*3/uL — ABNORMAL HIGH (ref 4.0–10.5)
nRBC: 0 % (ref 0.0–0.2)

## 2019-09-27 LAB — BASIC METABOLIC PANEL
Anion gap: 10 (ref 5–15)
BUN: 12 mg/dL (ref 8–23)
CO2: 24 mmol/L (ref 22–32)
Calcium: 8.6 mg/dL — ABNORMAL LOW (ref 8.9–10.3)
Chloride: 104 mmol/L (ref 98–111)
Creatinine, Ser: 0.7 mg/dL (ref 0.44–1.00)
GFR calc Af Amer: >60 mL/min (ref >60)
GFR calc non Af Amer: >60 mL/min (ref >60)
Glucose, Bld: 185 mg/dL — ABNORMAL HIGH (ref 70–99)
Potassium: 4.3 mmol/L (ref 3.5–5.1)
Sodium: 138 mmol/L (ref 135–145)

## 2019-09-27 MED ORDER — CELECOXIB 200 MG PO CAPS: 200.0000 mg | ORAL_CAPSULE | Freq: Two times a day (BID) | ORAL | 0 refills | Status: DC

## 2019-09-27 MED ORDER — FERROUS SULFATE 325 (65 FE) MG PO TABS: 325.0000 mg | ORAL_TABLET | Freq: Three times a day (TID) | ORAL | 0 refills | Status: DC

## 2019-09-27 MED ORDER — ASPIRIN 81 MG PO CHEW: 81.0000 mg | CHEWABLE_TABLET | Freq: Two times a day (BID) | ORAL | 0 refills | Status: AC

## 2019-09-27 MED ORDER — DOCUSATE SODIUM 100 MG PO CAPS: 100.0000 mg | ORAL_CAPSULE | Freq: Two times a day (BID) | ORAL | 0 refills | Status: DC

## 2019-09-27 MED ORDER — HYDROMORPHONE HCL 2 MG PO TABS: 2.0000 mg | ORAL_TABLET | ORAL | 0 refills | Status: DC | PRN

## 2019-09-27 MED ORDER — ACETAMINOPHEN 500 MG PO TABS: 1000.0000 mg | ORAL_TABLET | Freq: Three times a day (TID) | ORAL | 0 refills | Status: DC

## 2019-09-27 MED ORDER — METHOCARBAMOL 500 MG PO TABS: 500.0000 mg | ORAL_TABLET | Freq: Four times a day (QID) | ORAL | 0 refills | Status: DC | PRN

## 2019-09-27 MED ORDER — POLYETHYLENE GLYCOL 3350 17 G PO PACK: 17.0000 g | PACK | Freq: Two times a day (BID) | ORAL | 0 refills | Status: DC

## 2019-09-27 MED FILL — CELECOXIB 200 MG CAP: 200 | 30 days supply | Qty: 60 | Fill #0

## 2019-09-27 NOTE — Progress Notes (Signed)
Physical Therapy Treatment Patient Details Name: [name] MRN: [id number] DOB: 19-Sep-1951 Today's Date: 09/27/2019    History of Present Illness R TKA; PMH R AA-THA 2014    PT Comments    Pt is progressing well with mobility, she ambulated 46' with RW, distance limited by pain. Instructed pt in TKA HEP. Will plan to do stair training this afternoon.   Follow Up Recommendations  Follow surgeon's recommendation for DC plan and follow-up therapies     Equipment Recommendations  None recommended by PT    Recommendations for Other Services       Precautions / Restrictions Precautions Precautions: Fall Precaution Comments: h/o 1 fall in past year (flip flop caught on a step) Restrictions Weight Bearing Restrictions: No RLE Weight Bearing: Weight bearing as tolerated Other Position/Activity Restrictions: WBAT    Mobility  Bed Mobility Overal bed mobility: Modified Independent Bed Mobility: Supine to Sit     Supine to sit: Modified independent (Device/Increase time);HOB elevated        Transfers Overall transfer level: Needs assistance Equipment used: Rolling walker (2 wheeled) Transfers: Sit to/from Stand Sit to Stand: From elevated surface;Min guard         General transfer comment: VCs hand placement  Ambulation/Gait Ambulation/Gait assistance: Min guard Gait Distance (Feet): 50 Feet Assistive device: Rolling walker (2 wheeled) Gait Pattern/deviations: Step-to pattern;Decreased stance time - right;Decreased step length - left;Decreased step length - right Gait velocity: decr   General Gait Details: VCs sequencing, distance limited by pain   Stairs             Wheelchair Mobility    Modified Rankin (Stroke Patients Only)       Balance Overall balance assessment: Modified Independent                                          Cognition Arousal/Alertness: Awake/alert Behavior During Therapy: WFL for tasks  assessed/performed Overall Cognitive Status: Within Functional Limits for tasks assessed                                        Exercises Total Joint Exercises Ankle Circles/Pumps: AROM;Both;10 reps;Supine Quad Sets: AROM;Right;5 reps;Supine Towel Squeeze: AROM;Both;5 reps;Supine Short Arc Quad: AROM;Right;10 reps;Supine Heel Slides: AAROM;Right;10 reps;Supine Hip ABduction/ADduction: AROM;Right;10 reps;Supine Straight Leg Raises: AROM;Right;5 reps;Supine Long Arc Quad: AAROM;AROM;Right;Seated;10 reps Knee Flexion: AAROM;AROM;Right;10 reps;Seated Goniometric ROM: 5-65* AAROM R knee    General Comments        Pertinent Vitals/Pain Pain Score: 8  Pain Location: R knee Pain Descriptors / Indicators: Sore Pain Intervention(s): Limited activity within patient's tolerance;Monitored during session;Premedicated before session;Ice applied    Home Living                      Prior Function            PT Goals (current goals can now be found in the care plan section) Acute Rehab PT Goals Patient Stated Goal: play with grandkids PT Goal Formulation: With patient Time For Goal Achievement: 10/03/19 Potential to Achieve Goals: Good Progress towards PT goals: Progressing toward goals    Frequency    7X/week      PT Plan Current plan remains appropriate    Co-evaluation  AM-PAC PT "6 Clicks" Mobility   Outcome Measure  Help needed turning from your back to your side while in a flat bed without using bedrails?: A Little Help needed moving from lying on your back to sitting on the side of a flat bed without using bedrails?: A Little Help needed moving to and from a bed to a chair (including a wheelchair)?: A Little Help needed standing up from a chair using your arms (e.g., wheelchair or bedside chair)?: A Little Help needed to walk in hospital room?: A Little Help needed climbing 3-5 steps with a railing? : A Lot 6 Click Score:  17    End of Session Equipment Utilized During Treatment: Gait belt Activity Tolerance: Patient tolerated treatment well;Patient limited by pain Patient left: in chair;with call bell/phone within reach Nurse Communication: Mobility status PT Visit Diagnosis: Difficulty in walking, not elsewhere classified (R26.2);Muscle weakness (generalized) (M62.81);Pain Pain - Right/Left: Right Pain - part of body: Knee     Time: 0852-0930 PT Time Calculation (min) (ACUTE ONLY): 38 min  Charges:  $Gait Training: 8-22 mins $Therapeutic Exercise: 8-22 mins $Therapeutic Activity: 8-22 mins                     Blondell Reveal Kistler PT 09/27/2019  Acute Rehabilitation Services Pager [phone] Office [phone]

## 2019-09-27 NOTE — Progress Notes (Signed)
Physical Therapy Treatment Patient Details Name: [name] MRN: [id number] DOB: 1951/07/24 Today's Date: 09/27/2019    History of Present Illness R TKA    PT Comments    Pt is progressing well with mobility and is ready to DC home from PT standpoint. STair training completed and review of HEP completed with spouse present.   Follow Up Recommendations  Follow surgeon's recommendation for DC plan and follow-up therapies     Equipment Recommendations  None recommended by PT    Recommendations for Other Services       Precautions / Restrictions Precautions Precautions: Fall Precaution Comments: h/o 1 fall in past year (flip flop caught on a step) Restrictions Weight Bearing Restrictions: No Other Position/Activity Restrictions: WBAT    Mobility  Bed Mobility               General bed mobility comments: up walking in room  Transfers Overall transfer level: Needs assistance Equipment used: Rolling walker (2 wheeled) Transfers: Sit to/from Stand Sit to Stand: From elevated surface;Supervision         General transfer comment: VCs hand placement  Ambulation/Gait Ambulation/Gait assistance: Supervision Gait Distance (Feet): 70 Feet Assistive device: Rolling walker (2 wheeled) Gait Pattern/deviations: Step-to pattern;Decreased stance time - right;Decreased step length - left;Decreased step length - right Gait velocity: decr   General Gait Details: VCs sequencing, distance limited by pain   Stairs Stairs: Yes Stairs assistance: Min assist Stair Management: No rails;Step to pattern;Backwards Number of Stairs: 2 General stair comments: VCs sequencing, min A for RW, husband present and assisted with RW   Wheelchair Mobility    Modified Rankin (Stroke Patients Only)       Balance Overall balance assessment: Modified Independent                                          Cognition Arousal/Alertness: Awake/alert Behavior During  Therapy: WFL for tasks assessed/performed Overall Cognitive Status: Within Functional Limits for tasks assessed                                        Exercises Total Joint Exercises Ankle Circles/Pumps: AROM;Both;10 reps;Supine Quad Sets: AROM;Right;5 reps;Supine Towel Squeeze: AROM;Both;5 reps;Supine Short Arc Quad: AROM;Right;10 reps;Supine Heel Slides: AAROM;Right;10 reps;Supine Hip ABduction/ADduction: AROM;Right;10 reps;Supine Straight Leg Raises: AROM;Right;5 reps;Supine Long Arc Quad: AAROM;AROM;Right;Seated;10 reps Knee Flexion: AAROM;AROM;Right;10 reps;Seated    General Comments        Pertinent Vitals/Pain Pain Score: 8  Pain Location: R knee Pain Descriptors / Indicators: Sore Pain Intervention(s): Limited activity within patient's tolerance;Monitored during session;Premedicated before session;Ice applied    Home Living                      Prior Function            PT Goals (current goals can now be found in the care plan section) Acute Rehab PT Goals Patient Stated Goal: play with grandkids PT Goal Formulation: With patient Time For Goal Achievement: 10/03/19 Potential to Achieve Goals: Good Progress towards PT goals: Progressing toward goals    Frequency    7X/week      PT Plan Current plan remains appropriate    Co-evaluation              AM-PAC  PT "6 Clicks" Mobility   Outcome Measure  Help needed turning from your back to your side while in a flat bed without using bedrails?: A Little Help needed moving from lying on your back to sitting on the side of a flat bed without using bedrails?: A Little Help needed moving to and from a bed to a chair (including a wheelchair)?: A Little Help needed standing up from a chair using your arms (e.g., wheelchair or bedside chair)?: A Little Help needed to walk in hospital room?: None Help needed climbing 3-5 steps with a railing? : A Little 6 Click Score: 19    End of  Session Equipment Utilized During Treatment: Gait belt Activity Tolerance: Patient tolerated treatment well;Patient limited by pain Patient left: in chair;with call bell/phone within reach;with family/visitor present Nurse Communication: Mobility status PT Visit Diagnosis: Difficulty in walking, not elsewhere classified (R26.2);Muscle weakness (generalized) (M62.81);Pain Pain - Right/Left: Right Pain - part of body: Knee     Time: 1355-1417 PT Time Calculation (min) (ACUTE ONLY): 22 min  Charges:  $Gait Training: 8-22 mins                    Blondell Reveal Kistler PT 09/27/2019  Acute Rehabilitation Services Pager [phone] Office [phone]

## 2019-09-27 NOTE — Progress Notes (Signed)
     Subjective: 1 Day Post-Op Procedure(s) (LRB): TOTAL KNEE ARTHROPLASTY (Right)   Patient reports pain as mild, pain controlled medication.  No reported events throughout the night.  The procedure, findings and expectations moving forward were discussed in the office.  Patient is ready be discharged home, if she does well therapy.  Physical therapy is already scheduled.  Patient will follow-up in the office in 2 weeks.   Patient's anticipated LOS is less than 2 midnights, meeting these requirements: - Lives within 1 hour of care - Has a competent adult at home to recover with post-op recover - NO history of  - Chronic pain requiring opiods  - Diabetes  - Coronary Artery Disease  - Heart failure  - Heart attack  - Stroke  - DVT/VTE  - Cardiac arrhythmia  - Respiratory Failure/COPD  - Renal failure  - Anemia  - Advanced Liver disease       Objective:   VITALS:   Vitals:   09/27/19 0050 09/27/19 0608  BP: 129/78 114/71  Pulse: 74 64  Resp: 16 16  Temp: 97.8 F (36.6 C) (!) 97.5 F (36.4 C)  SpO2: 97% 96%    Dorsiflexion/Plantar flexion intact Incision: dressing C/D/I No cellulitis present Compartment soft  LABS Recent Labs    09/27/19 0242  HGB 10.1*  HCT 32.3*  WBC 14.3*  PLT 269    Recent Labs    09/27/19 0242  NA 138  K 4.3  BUN 12  CREATININE 0.70  GLUCOSE 185*     Assessment/Plan: 1 Day Post-Op Procedure(s) (LRB): TOTAL KNEE ARTHROPLASTY (Right) Foley cath DC'd Advance diet Up with therapy D/C IV fluids Discharge home Follow up in 2 weeks at [facility] Follow up with OLIN,Lekeisha Arenas D in 2 weeks.  Contact information:  [facility] [address] [phone]    Obese (BMI 30-39.9) Estimated body mass index is 32.88 kg/m as calculated from the following:   Height as of this encounter: 5\' 4"  (1.626 m).   Weight as of this encounter: 86.9 kg. Patient also counseled that weight  may inhibit the healing process Patient counseled that losing weight will help with future health issues        West Pugh. Gladis Soley   PAC  09/27/2019, 8:31 AM

## 2019-09-29 DIAGNOSIS — M25661 Stiffness of right knee, not elsewhere classified: Secondary | ICD-10-CM | POA: Diagnosis not present

## 2019-09-29 DIAGNOSIS — M25561 Pain in right knee: Secondary | ICD-10-CM | POA: Diagnosis not present

## 2019-10-02 DIAGNOSIS — M25561 Pain in right knee: Secondary | ICD-10-CM | POA: Diagnosis not present

## 2019-10-02 DIAGNOSIS — M25661 Stiffness of right knee, not elsewhere classified: Secondary | ICD-10-CM | POA: Diagnosis not present

## 2019-10-03 NOTE — Discharge Summary (Signed)
Physician Discharge Summary  Patient ID: [name] MRN: [id number] DOB/AGE: [birth year] 68 y.o.  Admit date: 09/26/2019 Discharge date: 09/27/2019   Procedures:  Procedure(s) (LRB): TOTAL KNEE ARTHROPLASTY (Right)  Attending Physician:  Dr. Paralee Cancel   Admission Diagnoses:   Right knee primary OA / pain  Discharge Diagnoses:  Principal Problem:   Status post total right knee replacement Active Problems:   Obese  Past Medical History:  Diagnosis Date  . Anemia yrs ago   hx of  . Anxiety   . Arthritis   . Elevated cholesterol   . GERD (gastroesophageal reflux disease)   . Hx of right bundle branch block last 5 yrs  . Hypertension   . Reflux     HPI:    [name], 68 y.o. female, has a history of pain and functional disability in the right knee due to arthritis and has failed non-surgical conservative treatments for greater than 12 weeks to include NSAID's and/or analgesics, corticosteriod injections, viscosupplementation injections and activity modification.  Onset of symptoms was gradual, starting 4 years ago with gradually worsening course since that time. The patient noted prior procedures on the knee to include  arthroscopy and menisectomy on the right knee(s).  Patient currently rates pain in the right knee(s) at 10 out of 10 with activity. Patient has night pain, worsening of pain with activity and weight bearing, pain that interferes with activities of daily living, pain with passive range of motion, crepitus and joint swelling.  Patient has evidence of periarticular osteophytes and joint space narrowing by imaging studies.  There is no active infection.  Risks, benefits and expectations were discussed with the patient.  Risks including but not limited to the risk of anesthesia, blood clots, nerve damage, blood vessel damage, failure of the prosthesis, infection and up to and including death.  Patient understand the risks, benefits and expectations and wishes to  proceed with surgery.   PCP: Wenda Low, MD   Discharged Condition: good  Hospital Course:  Patient underwent the above stated procedure on 09/26/2019. Patient tolerated the procedure well and brought to the recovery room in good condition and subsequently to the floor.  POD #1 BP: 114/71 ; Pulse: 64 ; Temp: 97.5 F (36.4 C) ; Resp: 16 Patient reports pain as mild, pain controlled medication.  No reported events throughout the night.  The procedure, findings and expectations moving forward were discussed in the office.  Patient is ready be discharged home. Dorsiflexion/plantar flexion intact, incision: dressing C/D/I, no cellulitis present and compartment soft.   LABS  Basename    HGB     10.1  HCT     32.3    Discharge Exam: General appearance: alert, cooperative and no distress Extremities: Homans sign is negative, no sign of DVT, no edema, redness or tenderness in the calves or thighs and no ulcers, gangrene or trophic changes  Disposition:  Home with follow up in 2 weeks   Follow-up Information    Paralee Cancel, MD. Schedule an appointment as soon as possible for a visit in 2 weeks.   Specialty: Orthopedic Surgery Contact information: [address] [place] [address] [id number]           Discharge Instructions    Call MD / Call 911   Complete by: As directed    If you experience chest pain or shortness of breath, CALL 911 and be transported to the hospital emergency room.  If you develope a fever  above 101 F, pus (white drainage) or increased drainage or redness at the wound, or calf pain, call your surgeon's office.   Change dressing   Complete by: As directed    Maintain surgical dressing until follow up in the clinic. If the edges start to pull up, may reinforce with tape. If the dressing is no longer working, may remove and cover with gauze and tape, but must keep the area dry and clean.  Call with any questions or concerns.    Constipation Prevention   Complete by: As directed    Drink plenty of fluids.  Prune juice may be helpful.  You may use a stool softener, such as Colace (over the counter) 100 mg twice a day.  Use MiraLax (over the counter) for constipation as needed.   Diet - low sodium heart healthy   Complete by: As directed    Discharge instructions   Complete by: As directed    Maintain surgical dressing until follow up in the clinic. If the edges start to pull up, may reinforce with tape. If the dressing is no longer working, may remove and cover with gauze and tape, but must keep the area dry and clean.  Follow up in 2 weeks at [facility]. Call with any questions or concerns.   Increase activity slowly as tolerated   Complete by: As directed    Weight bearing as tolerated with assist device (walker, cane, etc) as directed, use it as long as suggested by your surgeon or therapist, typically at least 4-6 weeks.   TED hose   Complete by: As directed    Use stockings (TED hose) for 2 weeks on both leg(s).  You may remove them at night for sleeping.      Allergies as of 09/27/2019      Reactions   Codeine Palpitations      Medication List    STOP taking these medications   aspirin 325 MG EC tablet Replaced by: aspirin 81 MG chewable tablet   HYDROcodone-acetaminophen 5-325 MG tablet Commonly known as: NORCO/VICODIN   tiZANidine 4 MG capsule Commonly known as: Zanaflex     TAKE these medications   acetaminophen 500 MG tablet Commonly known as: TYLENOL Take 2 tablets (1,000 mg total) by mouth every 8 (eight) hours.   albuterol 108 (90 Base) MCG/ACT inhaler Commonly known as: VENTOLIN HFA Inhale 2 puffs into the lungs every 6 (six) hours as needed for wheezing.   ALPRAZolam 0.5 MG tablet Commonly known as: XANAX Take 0.25 mg by mouth daily as needed for anxiety (when traveling).   aspirin 81 MG chewable tablet Commonly known as: Aspirin Childrens Chew 1 tablet (81 mg  total) by mouth 2 (two) times daily. Take for 4 weeks, then resume regular dose. Replaces: aspirin 325 MG EC tablet   celecoxib 200 MG capsule Commonly known as: CeleBREX Take 1 capsule (200 mg total) by mouth 2 (two) times daily.   cholecalciferol 1000 units tablet Commonly known as: VITAMIN D Take 1,000 Units by mouth daily.   diltiazem 180 MG 24 hr capsule Commonly known as: TIAZAC Take 180 mg by mouth daily before breakfast.   docusate sodium 100 MG capsule Commonly known as: Colace Take 1 capsule (100 mg total) by mouth 2 (two) times daily. What changed:   when to take this  reasons to take this   DULoxetine 60 MG capsule Commonly known as: CYMBALTA Take 60 mg by mouth daily before breakfast.   ferrous sulfate 325 (  65 FE) MG tablet Commonly known as: FerrouSul Take 1 tablet (325 mg total) by mouth 3 (three) times daily with meals for 14 days. What changed: when to take this   HYDROmorphone 2 MG tablet Commonly known as: Dilaudid Take 1-2 tablets (2-4 mg total) by mouth every 4 (four) hours as needed for severe pain.   lisinopril 10 MG tablet Commonly known as: ZESTRIL Take 10 mg by mouth daily before breakfast.   methocarbamol 500 MG tablet Commonly known as: Robaxin Take 1 tablet (500 mg total) by mouth every 6 (six) hours as needed for muscle spasms.   omeprazole 40 MG capsule Commonly known as: PRILOSEC Take 40 mg by mouth daily.   polyethylene glycol 17 g packet Commonly known as: MIRALAX / GLYCOLAX Take 17 g by mouth 2 (two) times daily. What changed:   when to take this  reasons to take this   simvastatin 20 MG tablet Commonly known as: ZOCOR Take 20 mg by mouth every evening.            Discharge Care Instructions  (From admission, onward)         Start     Ordered   09/27/19 0000  Change dressing    Comments: Maintain surgical dressing until follow up in the clinic. If the edges start to pull up, may reinforce with tape. If the  dressing is no longer working, may remove and cover with gauze and tape, but must keep the area dry and clean.  Call with any questions or concerns.   09/27/19 [id number]           Signed: [place] Pugh. Vaudie Engebretsen   PA-C  10/03/2019, 9:35 AM

## 2019-10-04 DIAGNOSIS — M25561 Pain in right knee: Secondary | ICD-10-CM | POA: Diagnosis not present

## 2019-10-04 DIAGNOSIS — M25661 Stiffness of right knee, not elsewhere classified: Secondary | ICD-10-CM | POA: Diagnosis not present

## 2019-10-05 DIAGNOSIS — K5792 Diverticulitis of intestine, part unspecified, without perforation or abscess without bleeding: Secondary | ICD-10-CM | POA: Diagnosis not present

## 2019-10-05 DIAGNOSIS — R1032 Left lower quadrant pain: Secondary | ICD-10-CM | POA: Diagnosis not present

## 2019-10-05 DIAGNOSIS — Z8719 Personal history of other diseases of the digestive system: Secondary | ICD-10-CM | POA: Diagnosis not present

## 2019-10-07 DIAGNOSIS — K5792 Diverticulitis of intestine, part unspecified, without perforation or abscess without bleeding: Secondary | ICD-10-CM | POA: Diagnosis not present

## 2019-10-07 DIAGNOSIS — D649 Anemia, unspecified: Secondary | ICD-10-CM | POA: Diagnosis not present

## 2019-10-09 DIAGNOSIS — D649 Anemia, unspecified: Secondary | ICD-10-CM | POA: Diagnosis not present

## 2019-10-09 DIAGNOSIS — M25661 Stiffness of right knee, not elsewhere classified: Secondary | ICD-10-CM | POA: Diagnosis not present

## 2019-10-09 DIAGNOSIS — M25561 Pain in right knee: Secondary | ICD-10-CM | POA: Diagnosis not present

## 2019-10-09 DIAGNOSIS — K5792 Diverticulitis of intestine, part unspecified, without perforation or abscess without bleeding: Secondary | ICD-10-CM | POA: Diagnosis not present

## 2019-10-09 MED FILL — traMADol HCL 50 MG TABS: 50 | 8 days supply | Qty: 60 | Fill #0

## 2019-10-11 DIAGNOSIS — M25561 Pain in right knee: Secondary | ICD-10-CM | POA: Diagnosis not present

## 2019-10-11 DIAGNOSIS — M25661 Stiffness of right knee, not elsewhere classified: Secondary | ICD-10-CM | POA: Diagnosis not present

## 2019-10-13 DIAGNOSIS — M25561 Pain in right knee: Secondary | ICD-10-CM | POA: Diagnosis not present

## 2019-10-13 DIAGNOSIS — M25661 Stiffness of right knee, not elsewhere classified: Secondary | ICD-10-CM | POA: Diagnosis not present

## 2019-10-13 MED FILL — OMEPRAZOLE 40 MG CPDR: 40 | 90 days supply | Qty: 90 | Fill #0

## 2019-10-16 DIAGNOSIS — M25661 Stiffness of right knee, not elsewhere classified: Secondary | ICD-10-CM | POA: Diagnosis not present

## 2019-10-16 DIAGNOSIS — M25561 Pain in right knee: Secondary | ICD-10-CM | POA: Diagnosis not present

## 2019-10-18 DIAGNOSIS — M25661 Stiffness of right knee, not elsewhere classified: Secondary | ICD-10-CM | POA: Diagnosis not present

## 2019-10-18 DIAGNOSIS — M25561 Pain in right knee: Secondary | ICD-10-CM | POA: Diagnosis not present

## 2019-10-23 DIAGNOSIS — M25661 Stiffness of right knee, not elsewhere classified: Secondary | ICD-10-CM | POA: Diagnosis not present

## 2019-10-23 DIAGNOSIS — M25561 Pain in right knee: Secondary | ICD-10-CM | POA: Diagnosis not present

## 2019-10-26 DIAGNOSIS — M25661 Stiffness of right knee, not elsewhere classified: Secondary | ICD-10-CM | POA: Diagnosis not present

## 2019-10-26 DIAGNOSIS — M25561 Pain in right knee: Secondary | ICD-10-CM | POA: Diagnosis not present

## 2019-10-31 DIAGNOSIS — M25561 Pain in right knee: Secondary | ICD-10-CM | POA: Diagnosis not present

## 2019-10-31 DIAGNOSIS — M25661 Stiffness of right knee, not elsewhere classified: Secondary | ICD-10-CM | POA: Diagnosis not present

## 2019-11-06 DIAGNOSIS — M25661 Stiffness of right knee, not elsewhere classified: Secondary | ICD-10-CM | POA: Diagnosis not present

## 2019-11-06 DIAGNOSIS — M25561 Pain in right knee: Secondary | ICD-10-CM | POA: Diagnosis not present

## 2019-11-07 DIAGNOSIS — M179 Osteoarthritis of knee, unspecified: Secondary | ICD-10-CM | POA: Diagnosis not present

## 2019-11-07 DIAGNOSIS — I10 Essential (primary) hypertension: Secondary | ICD-10-CM | POA: Diagnosis not present

## 2019-11-07 DIAGNOSIS — R7309 Other abnormal glucose: Secondary | ICD-10-CM | POA: Diagnosis not present

## 2019-11-07 DIAGNOSIS — F324 Major depressive disorder, single episode, in partial remission: Secondary | ICD-10-CM | POA: Diagnosis not present

## 2019-11-07 DIAGNOSIS — Z1389 Encounter for screening for other disorder: Secondary | ICD-10-CM | POA: Diagnosis not present

## 2019-11-07 DIAGNOSIS — Z0000 Encounter for general adult medical examination without abnormal findings: Secondary | ICD-10-CM | POA: Diagnosis not present

## 2019-11-07 DIAGNOSIS — E559 Vitamin D deficiency, unspecified: Secondary | ICD-10-CM | POA: Diagnosis not present

## 2019-11-07 DIAGNOSIS — E7800 Pure hypercholesterolemia, unspecified: Secondary | ICD-10-CM | POA: Diagnosis not present

## 2019-11-07 DIAGNOSIS — K219 Gastro-esophageal reflux disease without esophagitis: Secondary | ICD-10-CM | POA: Diagnosis not present

## 2019-11-07 DIAGNOSIS — F419 Anxiety disorder, unspecified: Secondary | ICD-10-CM | POA: Diagnosis not present

## 2019-11-07 MED FILL — LISINOPRIL 10 MG TABS: 10 | 90 days supply | Qty: 90 | Fill #0

## 2019-11-07 MED FILL — DULOXETINE HCL 60 MG CPEP: 60 | 90 days supply | Qty: 90 | Fill #0

## 2019-11-07 MED FILL — ALPRAZolam 0.5 MG TABS: 0.5 | 15 days supply | Qty: 30 | Fill #0

## 2019-11-07 MED FILL — DILTIAZEM HCL ER COATED BEA: 180 | 90 days supply | Qty: 90 | Fill #0

## 2019-11-07 MED FILL — SIMVASTATIN 20 MG TABLET: 20 | 90 days supply | Qty: 90 | Fill #0

## 2019-11-09 DIAGNOSIS — M25661 Stiffness of right knee, not elsewhere classified: Secondary | ICD-10-CM | POA: Diagnosis not present

## 2019-11-09 DIAGNOSIS — M25561 Pain in right knee: Secondary | ICD-10-CM | POA: Diagnosis not present

## 2019-11-10 DIAGNOSIS — Z471 Aftercare following joint replacement surgery: Secondary | ICD-10-CM | POA: Diagnosis not present

## 2019-11-10 DIAGNOSIS — Z96651 Presence of right artificial knee joint: Secondary | ICD-10-CM | POA: Diagnosis not present

## 2019-11-15 DIAGNOSIS — M25661 Stiffness of right knee, not elsewhere classified: Secondary | ICD-10-CM | POA: Diagnosis not present

## 2019-11-15 DIAGNOSIS — M25561 Pain in right knee: Secondary | ICD-10-CM | POA: Diagnosis not present

## 2019-11-20 DIAGNOSIS — M25561 Pain in right knee: Secondary | ICD-10-CM | POA: Diagnosis not present

## 2019-11-20 DIAGNOSIS — M25661 Stiffness of right knee, not elsewhere classified: Secondary | ICD-10-CM | POA: Diagnosis not present

## 2019-11-20 MED FILL — METHOCARBAMOL 500 MG TABS: 500 | 7 days supply | Qty: 30 | Fill #0

## 2019-11-23 DIAGNOSIS — M25561 Pain in right knee: Secondary | ICD-10-CM | POA: Diagnosis not present

## 2019-11-23 DIAGNOSIS — M25661 Stiffness of right knee, not elsewhere classified: Secondary | ICD-10-CM | POA: Diagnosis not present

## 2019-11-28 DIAGNOSIS — M25561 Pain in right knee: Secondary | ICD-10-CM | POA: Diagnosis not present

## 2019-11-28 DIAGNOSIS — M25661 Stiffness of right knee, not elsewhere classified: Secondary | ICD-10-CM | POA: Diagnosis not present

## 2019-12-07 DIAGNOSIS — M25661 Stiffness of right knee, not elsewhere classified: Secondary | ICD-10-CM | POA: Diagnosis not present

## 2019-12-07 DIAGNOSIS — M25561 Pain in right knee: Secondary | ICD-10-CM | POA: Diagnosis not present

## 2019-12-13 DIAGNOSIS — M25661 Stiffness of right knee, not elsewhere classified: Secondary | ICD-10-CM | POA: Diagnosis not present

## 2019-12-13 DIAGNOSIS — M25561 Pain in right knee: Secondary | ICD-10-CM | POA: Diagnosis not present

## 2019-12-27 DIAGNOSIS — Z124 Encounter for screening for malignant neoplasm of cervix: Secondary | ICD-10-CM | POA: Diagnosis not present

## 2019-12-27 DIAGNOSIS — F419 Anxiety disorder, unspecified: Secondary | ICD-10-CM | POA: Insufficient documentation

## 2019-12-27 DIAGNOSIS — N952 Postmenopausal atrophic vaginitis: Secondary | ICD-10-CM | POA: Diagnosis not present

## 2019-12-27 DIAGNOSIS — Z6831 Body mass index (BMI) 31.0-31.9, adult: Secondary | ICD-10-CM | POA: Diagnosis not present

## 2020-01-08 MED FILL — OMEPRAZOLE 40 MG CPDR: 40 | 90 days supply | Qty: 90 | Fill #1

## 2020-01-10 DIAGNOSIS — I10 Essential (primary) hypertension: Secondary | ICD-10-CM | POA: Diagnosis not present

## 2020-01-10 DIAGNOSIS — F324 Major depressive disorder, single episode, in partial remission: Secondary | ICD-10-CM | POA: Diagnosis not present

## 2020-01-10 DIAGNOSIS — M179 Osteoarthritis of knee, unspecified: Secondary | ICD-10-CM | POA: Diagnosis not present

## 2020-01-10 DIAGNOSIS — E7800 Pure hypercholesterolemia, unspecified: Secondary | ICD-10-CM | POA: Diagnosis not present

## 2020-01-22 DIAGNOSIS — S300XXA Contusion of lower back and pelvis, initial encounter: Secondary | ICD-10-CM | POA: Diagnosis not present

## 2020-01-22 DIAGNOSIS — R309 Painful micturition, unspecified: Secondary | ICD-10-CM | POA: Diagnosis not present

## 2020-01-22 MED FILL — NITROFURANTOIN MONO-MCR 100: 100 | 7 days supply | Qty: 14 | Fill #0

## 2020-01-24 MED FILL — SULFAMETHOXAZOLE-TMP DS TAB: 800-160 | 7 days supply | Qty: 14 | Fill #0

## 2020-01-29 MED FILL — FLUCONAZOLE 150 MG TABS: 150 | 3 days supply | Qty: 2 | Fill #0

## 2020-02-02 MED FILL — DILTIAZEM HCL ER COATED BEA: 180 | 90 days supply | Qty: 90 | Fill #1

## 2020-02-02 MED FILL — LISINOPRIL 10 MG TABS: 10 | 90 days supply | Qty: 90 | Fill #1

## 2020-02-14 MED FILL — SIMVASTATIN 20 MG TABLET: 20 | 90 days supply | Qty: 90 | Fill #1

## 2020-02-14 MED FILL — DULOXETINE HCL 60 MG CPEP: 60 | 90 days supply | Qty: 90 | Fill #1

## 2020-02-22 DIAGNOSIS — R21 Rash and other nonspecific skin eruption: Secondary | ICD-10-CM | POA: Diagnosis not present

## 2020-02-22 MED FILL — predniSONE 10 MG (21) TBPK: 10 | 6 days supply | Qty: 21 | Fill #0

## 2020-02-22 MED FILL — TRIAMCINOLONE ACETONIDE 0.1: 0.1 | 7 days supply | Qty: 30 | Fill #0

## 2020-02-28 DIAGNOSIS — E7800 Pure hypercholesterolemia, unspecified: Secondary | ICD-10-CM | POA: Diagnosis not present

## 2020-02-28 DIAGNOSIS — I10 Essential (primary) hypertension: Secondary | ICD-10-CM | POA: Diagnosis not present

## 2020-02-28 DIAGNOSIS — F324 Major depressive disorder, single episode, in partial remission: Secondary | ICD-10-CM | POA: Diagnosis not present

## 2020-02-28 DIAGNOSIS — M179 Osteoarthritis of knee, unspecified: Secondary | ICD-10-CM | POA: Diagnosis not present

## 2020-03-12 DIAGNOSIS — M25511 Pain in right shoulder: Secondary | ICD-10-CM | POA: Diagnosis not present

## 2020-03-12 DIAGNOSIS — M7541 Impingement syndrome of right shoulder: Secondary | ICD-10-CM | POA: Diagnosis not present

## 2020-04-01 MED FILL — OMEPRAZOLE 40 MG CPDR: 40 | 90 days supply | Qty: 90 | Fill #2

## 2020-04-24 DIAGNOSIS — M25511 Pain in right shoulder: Secondary | ICD-10-CM | POA: Diagnosis not present

## 2020-04-24 DIAGNOSIS — M25562 Pain in left knee: Secondary | ICD-10-CM | POA: Diagnosis not present

## 2020-04-24 DIAGNOSIS — M1712 Unilateral primary osteoarthritis, left knee: Secondary | ICD-10-CM | POA: Diagnosis not present

## 2020-04-24 DIAGNOSIS — M7541 Impingement syndrome of right shoulder: Secondary | ICD-10-CM | POA: Diagnosis not present

## 2020-05-02 DIAGNOSIS — M25512 Pain in left shoulder: Secondary | ICD-10-CM | POA: Diagnosis not present

## 2020-05-02 DIAGNOSIS — M25511 Pain in right shoulder: Secondary | ICD-10-CM | POA: Diagnosis not present

## 2020-05-07 MED FILL — LISINOPRIL 10 MG TABS: 10 | 90 days supply | Qty: 90 | Fill #2

## 2020-05-07 MED FILL — DILTIAZEM HCL ER COATED BEA: 180 | 90 days supply | Qty: 90 | Fill #2

## 2020-05-07 MED FILL — DULOXETINE HCL 60 MG CPEP: 60 | 90 days supply | Qty: 90 | Fill #2

## 2020-05-07 MED FILL — SIMVASTATIN 20 MG TABLET: 20 | 90 days supply | Qty: 90 | Fill #2

## 2020-05-08 DIAGNOSIS — M25511 Pain in right shoulder: Secondary | ICD-10-CM | POA: Diagnosis not present

## 2020-05-08 DIAGNOSIS — M25512 Pain in left shoulder: Secondary | ICD-10-CM | POA: Diagnosis not present

## 2020-05-10 DIAGNOSIS — M25512 Pain in left shoulder: Secondary | ICD-10-CM | POA: Diagnosis not present

## 2020-05-10 DIAGNOSIS — M25511 Pain in right shoulder: Secondary | ICD-10-CM | POA: Diagnosis not present

## 2020-05-13 DIAGNOSIS — M25512 Pain in left shoulder: Secondary | ICD-10-CM | POA: Diagnosis not present

## 2020-05-13 DIAGNOSIS — M25511 Pain in right shoulder: Secondary | ICD-10-CM | POA: Diagnosis not present

## 2020-05-17 DIAGNOSIS — M25512 Pain in left shoulder: Secondary | ICD-10-CM | POA: Diagnosis not present

## 2020-05-17 DIAGNOSIS — M25511 Pain in right shoulder: Secondary | ICD-10-CM | POA: Diagnosis not present

## 2020-05-24 DIAGNOSIS — I10 Essential (primary) hypertension: Secondary | ICD-10-CM | POA: Diagnosis not present

## 2020-05-24 DIAGNOSIS — M797 Fibromyalgia: Secondary | ICD-10-CM | POA: Diagnosis not present

## 2020-05-24 DIAGNOSIS — E7800 Pure hypercholesterolemia, unspecified: Secondary | ICD-10-CM | POA: Diagnosis not present

## 2020-05-24 DIAGNOSIS — F324 Major depressive disorder, single episode, in partial remission: Secondary | ICD-10-CM | POA: Diagnosis not present

## 2020-05-24 DIAGNOSIS — R7303 Prediabetes: Secondary | ICD-10-CM | POA: Diagnosis not present

## 2020-05-24 DIAGNOSIS — Z23 Encounter for immunization: Secondary | ICD-10-CM | POA: Diagnosis not present

## 2020-05-24 DIAGNOSIS — M179 Osteoarthritis of knee, unspecified: Secondary | ICD-10-CM | POA: Diagnosis not present

## 2020-05-24 DIAGNOSIS — J309 Allergic rhinitis, unspecified: Secondary | ICD-10-CM | POA: Diagnosis not present

## 2020-05-24 DIAGNOSIS — K219 Gastro-esophageal reflux disease without esophagitis: Secondary | ICD-10-CM | POA: Diagnosis not present

## 2020-05-24 MED FILL — ALBUTEROL SULFATE HFA 108 (: 108 (90 BAS | 25 days supply | Qty: 9 | Fill #0

## 2020-05-27 DIAGNOSIS — I10 Essential (primary) hypertension: Secondary | ICD-10-CM | POA: Diagnosis not present

## 2020-05-27 DIAGNOSIS — F324 Major depressive disorder, single episode, in partial remission: Secondary | ICD-10-CM | POA: Diagnosis not present

## 2020-05-27 DIAGNOSIS — M179 Osteoarthritis of knee, unspecified: Secondary | ICD-10-CM | POA: Diagnosis not present

## 2020-05-27 DIAGNOSIS — E7800 Pure hypercholesterolemia, unspecified: Secondary | ICD-10-CM | POA: Diagnosis not present

## 2020-05-28 DIAGNOSIS — H524 Presbyopia: Secondary | ICD-10-CM | POA: Diagnosis not present

## 2020-05-28 DIAGNOSIS — H2513 Age-related nuclear cataract, bilateral: Secondary | ICD-10-CM | POA: Diagnosis not present

## 2020-05-28 DIAGNOSIS — H52203 Unspecified astigmatism, bilateral: Secondary | ICD-10-CM | POA: Diagnosis not present

## 2020-06-13 DIAGNOSIS — Z1231 Encounter for screening mammogram for malignant neoplasm of breast: Secondary | ICD-10-CM | POA: Diagnosis not present

## 2020-06-13 MED FILL — ALBUTEROL SULFATE HFA 108 (: 108 (90 BAS | 16 days supply | Qty: 9 | Fill #0

## 2020-06-29 DIAGNOSIS — M25511 Pain in right shoulder: Secondary | ICD-10-CM | POA: Diagnosis not present

## 2020-06-29 DIAGNOSIS — M25512 Pain in left shoulder: Secondary | ICD-10-CM | POA: Diagnosis not present

## 2020-07-05 MED FILL — OMEPRAZOLE 40 MG CPDR: 40 | 90 days supply | Qty: 90 | Fill #3

## 2020-07-12 DIAGNOSIS — M25511 Pain in right shoulder: Secondary | ICD-10-CM | POA: Diagnosis not present

## 2020-07-12 DIAGNOSIS — M25512 Pain in left shoulder: Secondary | ICD-10-CM | POA: Diagnosis not present

## 2020-07-18 DIAGNOSIS — M7502 Adhesive capsulitis of left shoulder: Secondary | ICD-10-CM | POA: Diagnosis not present

## 2020-07-18 DIAGNOSIS — M7501 Adhesive capsulitis of right shoulder: Secondary | ICD-10-CM | POA: Diagnosis not present

## 2020-07-31 DIAGNOSIS — M7501 Adhesive capsulitis of right shoulder: Secondary | ICD-10-CM | POA: Diagnosis not present

## 2020-07-31 DIAGNOSIS — M7502 Adhesive capsulitis of left shoulder: Secondary | ICD-10-CM | POA: Diagnosis not present

## 2020-08-05 MED FILL — DILTIAZEM HCL ER COATED BEA: 180 | 90 days supply | Qty: 90 | Fill #3

## 2020-08-05 MED FILL — DULOXETINE HCL 60 MG CPEP: 60 | 90 days supply | Qty: 90 | Fill #3

## 2020-08-05 MED FILL — SIMVASTATIN 20 MG TABLET: 20 | 90 days supply | Qty: 90 | Fill #3

## 2020-08-05 MED FILL — LISINOPRIL 10 MG TABS: 10 | 90 days supply | Qty: 90 | Fill #3

## 2020-08-06 DIAGNOSIS — M7502 Adhesive capsulitis of left shoulder: Secondary | ICD-10-CM | POA: Diagnosis not present

## 2020-08-06 DIAGNOSIS — M7501 Adhesive capsulitis of right shoulder: Secondary | ICD-10-CM | POA: Diagnosis not present

## 2020-08-08 DIAGNOSIS — M7502 Adhesive capsulitis of left shoulder: Secondary | ICD-10-CM | POA: Diagnosis not present

## 2020-08-08 DIAGNOSIS — M7501 Adhesive capsulitis of right shoulder: Secondary | ICD-10-CM | POA: Diagnosis not present

## 2020-08-13 DIAGNOSIS — M7501 Adhesive capsulitis of right shoulder: Secondary | ICD-10-CM | POA: Diagnosis not present

## 2020-08-13 DIAGNOSIS — M7502 Adhesive capsulitis of left shoulder: Secondary | ICD-10-CM | POA: Diagnosis not present

## 2020-08-20 DIAGNOSIS — M7502 Adhesive capsulitis of left shoulder: Secondary | ICD-10-CM | POA: Diagnosis not present

## 2020-08-20 DIAGNOSIS — M7501 Adhesive capsulitis of right shoulder: Secondary | ICD-10-CM | POA: Diagnosis not present

## 2020-08-22 DIAGNOSIS — M7502 Adhesive capsulitis of left shoulder: Secondary | ICD-10-CM | POA: Diagnosis not present

## 2020-08-22 DIAGNOSIS — M7501 Adhesive capsulitis of right shoulder: Secondary | ICD-10-CM | POA: Diagnosis not present

## 2020-08-27 DIAGNOSIS — M7502 Adhesive capsulitis of left shoulder: Secondary | ICD-10-CM | POA: Diagnosis not present

## 2020-08-27 DIAGNOSIS — M7501 Adhesive capsulitis of right shoulder: Secondary | ICD-10-CM | POA: Diagnosis not present

## 2020-09-03 DIAGNOSIS — M7501 Adhesive capsulitis of right shoulder: Secondary | ICD-10-CM | POA: Diagnosis not present

## 2020-09-03 DIAGNOSIS — M7502 Adhesive capsulitis of left shoulder: Secondary | ICD-10-CM | POA: Diagnosis not present

## 2020-09-09 ENCOUNTER — Other Ambulatory Visit: Payer: PPO

## 2020-09-09 DIAGNOSIS — Z20822 Contact with and (suspected) exposure to covid-19: Secondary | ICD-10-CM | POA: Diagnosis not present

## 2020-09-10 LAB — SARS-COV-2, NAA 2 DAY TAT

## 2020-09-10 LAB — NOVEL CORONAVIRUS, NAA: SARS-CoV-2, NAA: NOT DETECTED

## 2020-09-12 ENCOUNTER — Other Ambulatory Visit (HOSPITAL_COMMUNITY): Payer: Self-pay | Admitting: Physician Assistant

## 2020-09-12 DIAGNOSIS — J019 Acute sinusitis, unspecified: Secondary | ICD-10-CM | POA: Diagnosis not present

## 2020-09-12 MED FILL — AZITHROMYCIN 250 MG TABLET: 250 | 5 days supply | Qty: 6 | Fill #0

## 2020-09-13 ENCOUNTER — Other Ambulatory Visit (HOSPITAL_COMMUNITY): Payer: Self-pay | Admitting: Internal Medicine

## 2020-09-13 MED FILL — ALPRAZolam 0.5 MG TABS: 0.5 | 30 days supply | Qty: 30 | Fill #0

## 2020-09-25 DIAGNOSIS — Z96641 Presence of right artificial hip joint: Secondary | ICD-10-CM | POA: Diagnosis not present

## 2020-09-25 DIAGNOSIS — M79651 Pain in right thigh: Secondary | ICD-10-CM | POA: Diagnosis not present

## 2020-09-25 DIAGNOSIS — Z96651 Presence of right artificial knee joint: Secondary | ICD-10-CM | POA: Diagnosis not present

## 2020-09-25 DIAGNOSIS — M1712 Unilateral primary osteoarthritis, left knee: Secondary | ICD-10-CM | POA: Diagnosis not present

## 2020-10-04 DIAGNOSIS — R509 Fever, unspecified: Secondary | ICD-10-CM | POA: Diagnosis not present

## 2020-10-04 DIAGNOSIS — Z03818 Encounter for observation for suspected exposure to other biological agents ruled out: Secondary | ICD-10-CM | POA: Diagnosis not present

## 2020-10-04 DIAGNOSIS — R197 Diarrhea, unspecified: Secondary | ICD-10-CM | POA: Diagnosis not present

## 2020-10-04 DIAGNOSIS — R1033 Periumbilical pain: Secondary | ICD-10-CM | POA: Diagnosis not present

## 2020-10-07 ENCOUNTER — Other Ambulatory Visit (HOSPITAL_COMMUNITY): Payer: Self-pay | Admitting: Internal Medicine

## 2020-10-07 MED FILL — OMEPRAZOLE 40 MG CPDR: 40 | 90 days supply | Qty: 90 | Fill #0

## 2020-10-08 ENCOUNTER — Other Ambulatory Visit (HOSPITAL_COMMUNITY): Payer: Self-pay

## 2020-10-08 MED FILL — AMOXICILLIN 500 MG CAPSULE: 500 | 4 days supply | Qty: 16 | Fill #0

## 2020-10-09 MED FILL — HYDROCODON-APAP 7.5-325: 7.5-325 | 4 days supply | Qty: 15 | Fill #0

## 2020-10-14 DIAGNOSIS — Z23 Encounter for immunization: Secondary | ICD-10-CM | POA: Diagnosis not present

## 2020-10-14 DIAGNOSIS — Z01818 Encounter for other preprocedural examination: Secondary | ICD-10-CM | POA: Diagnosis not present

## 2020-10-14 DIAGNOSIS — M179 Osteoarthritis of knee, unspecified: Secondary | ICD-10-CM | POA: Diagnosis not present

## 2020-10-18 DIAGNOSIS — E7800 Pure hypercholesterolemia, unspecified: Secondary | ICD-10-CM | POA: Diagnosis not present

## 2020-10-18 DIAGNOSIS — K219 Gastro-esophageal reflux disease without esophagitis: Secondary | ICD-10-CM | POA: Diagnosis not present

## 2020-10-18 DIAGNOSIS — I10 Essential (primary) hypertension: Secondary | ICD-10-CM | POA: Diagnosis not present

## 2020-10-18 DIAGNOSIS — F324 Major depressive disorder, single episode, in partial remission: Secondary | ICD-10-CM | POA: Diagnosis not present

## 2020-10-18 DIAGNOSIS — M179 Osteoarthritis of knee, unspecified: Secondary | ICD-10-CM | POA: Diagnosis not present

## 2020-10-28 ENCOUNTER — Other Ambulatory Visit (HOSPITAL_COMMUNITY): Payer: Commercial Managed Care - PPO

## 2020-11-05 ENCOUNTER — Other Ambulatory Visit (HOSPITAL_COMMUNITY): Payer: Self-pay | Admitting: Internal Medicine

## 2020-11-05 MED FILL — DULOXETINE HCL 60 MG CPEP: 60 | 90 days supply | Qty: 90 | Fill #0

## 2020-11-05 MED FILL — LISINOPRIL 10 MG TABS: 10 | 90 days supply | Qty: 90 | Fill #0

## 2020-11-05 MED FILL — DILTIAZEM HCL ER COATED BEA: 180 | 90 days supply | Qty: 90 | Fill #0

## 2020-11-05 MED FILL — SIMVASTATIN 20 MG TABLET: 20 | 90 days supply | Qty: 90 | Fill #0

## 2020-11-18 DIAGNOSIS — M1712 Unilateral primary osteoarthritis, left knee: Secondary | ICD-10-CM | POA: Diagnosis not present

## 2020-11-18 NOTE — H&P (Signed)
TOTAL KNEE ADMISSION H&P  Patient is being admitted for left total knee arthroplasty.  Subjective:  Chief Complaint:  Left knee OA / pain  HPI: [name], 69 y.o. female, has a history of pain and functional disability in the left knee due to arthritis and has failed non-surgical conservative treatments for greater than 12 weeks to include NSAID's and/or analgesics, corticosteriod injections, viscosupplementation injections and activity modification.  Onset of symptoms was gradual, starting 2-3 years ago with gradually worsening course since that time. The patient noted prior procedures on the knee to include  arthroplasty on the right knee in 09/2019.  Patient currently rates pain in the left knee(s) at 7 out of 10 with activity. Patient has night pain, worsening of pain with activity and weight bearing, pain that interferes with activities of daily living, pain with passive range of motion, crepitus and joint swelling.  Patient has evidence of periarticular osteophytes and joint space narrowing by imaging studies.  There is no active infection.  Risks, benefits and expectations were discussed with the patient.  Risks including but not limited to the risk of anesthesia, blood clots, nerve damage, blood vessel damage, failure of the prosthesis, infection and up to and including death.  Patient understand the risks, benefits and expectations and wishes to proceed with surgery.   D/C Plans:       Home   Post-op Meds:       No Rx given   Tranexamic Acid:      To be given - IV   Decadron:      Is to be given  FYI:      ASA  Norco (wanting to try even with codeine allergy, didn't want to use Dilaudid)   DME:   Rx sent for - RW & 3-N-1  PT:   OPPT   Pharmacy: [facility]    Patient Active Problem List   Diagnosis Date Noted  . Status post total right knee replacement 09/26/2019  . S/P right THA, AA 05/03/2013  . Expected blood loss anemia 05/03/2013  . Obese 05/03/2013  .  Postmenopausal bleeding 03/30/2013  . Elevated cholesterol   . Reflux   . Hypertension    Past Medical History:  Diagnosis Date  . Anemia yrs ago   hx of  . Anxiety   . Arthritis   . Elevated cholesterol   . GERD (gastroesophageal reflux disease)   . Hx of right bundle branch block last 5 yrs  . Hypertension   . Reflux     Past Surgical History:  Procedure Laterality Date  . ANKLE SURGERY Right 7- 10 yrs ago  . APPENDECTOMY  3 yrs ago  . CHOLECYSTECTOMY    . KNEE SURGERY Bilateral  83yrs ago   Arthroscopic  . TOTAL HIP ARTHROPLASTY Right 05/02/2013   Procedure: RIGHT TOTAL HIP ARTHROPLASTY ANTERIOR APPROACH;  Surgeon: Mauri Pole, MD;  Location: [facility];  Service: Orthopedics;  Laterality: Right;  . TOTAL KNEE ARTHROPLASTY Right 09/26/2019   Procedure: TOTAL KNEE ARTHROPLASTY;  Surgeon: Paralee Cancel, MD;  Location: [facility];  Service: Orthopedics;  Laterality: Right;  70 mins    No current facility-administered medications for this encounter.   Current Outpatient Medications  Medication Sig Dispense Refill Last Dose  . acetaminophen (TYLENOL) 500 MG tablet Take 2 tablets (1,000 mg total) by mouth every 8 (eight) hours. 30 tablet 0   . albuterol (PROVENTIL HFA;VENTOLIN HFA) 108 (90 BASE) MCG/ACT inhaler Inhale 2 puffs into the lungs  every 6 (six) hours as needed for wheezing.     Marland Kitchen ALPRAZolam (XANAX) 0.5 MG tablet Take 0.25 mg by mouth daily as needed for anxiety (when traveling).     . celecoxib (CELEBREX) 200 MG capsule Take 1 capsule (200 mg total) by mouth 2 (two) times daily. 60 capsule 0   . cholecalciferol (VITAMIN D) 1000 UNITS tablet Take 1,000 Units by mouth daily.      Marland Kitchen diltiazem (TIAZAC) 180 MG 24 hr capsule Take 180 mg by mouth daily before breakfast.      . docusate sodium (COLACE) 100 MG capsule Take 1 capsule (100 mg total) by mouth 2 (two) times daily. 28 capsule 0   . DULoxetine (CYMBALTA) 60 MG capsule Take 60 mg by mouth daily before breakfast.      .  ferrous sulfate (FERROUSUL) 325 (65 FE) MG tablet Take 1 tablet (325 mg total) by mouth 3 (three) times daily with meals for 14 days. 42 tablet 0   . HYDROmorphone (DILAUDID) 2 MG tablet Take 1-2 tablets (2-4 mg total) by mouth every 4 (four) hours as needed for severe pain. 60 tablet 0   . lisinopril (PRINIVIL,ZESTRIL) 10 MG tablet Take 10 mg by mouth daily before breakfast.     . methocarbamol (ROBAXIN) 500 MG tablet Take 1 tablet (500 mg total) by mouth every 6 (six) hours as needed for muscle spasms. 40 tablet 0   . omeprazole (PRILOSEC) 40 MG capsule Take 40 mg by mouth daily.     . polyethylene glycol (MIRALAX / GLYCOLAX) 17 g packet Take 17 g by mouth 2 (two) times daily. 28 packet 0   . simvastatin (ZOCOR) 20 MG tablet Take 20 mg by mouth every evening.       Allergies  Allergen Reactions  . Codeine Palpitations    Social History   Tobacco Use  . Smoking status: Former Smoker    Packs/day: 1.00    Years: 10.00    Pack years: 10.00    Types: Cigarettes    Quit date: 03/31/1979    Years since quitting: 41.6  . Smokeless tobacco: Never Used  Substance Use Topics  . Alcohol use: Yes    Comment: very rare    Family History  Problem Relation Age of Onset  . Diabetes Mother   . Hypertension Mother   . Breast cancer Mother 66  . Hypertension Father   . Diabetes Maternal Grandmother   . Colon cancer Maternal Grandfather   . Cancer Maternal Grandfather        rectal-colon  . Heart disease Paternal Grandmother   . Heart disease Paternal Grandfather      Review of Systems  Constitutional: Negative.   HENT: Negative.   Eyes: Negative.   Respiratory: Negative.   Cardiovascular: Negative.   Gastrointestinal: Positive for heartburn.  Genitourinary: Negative.   Musculoskeletal: Positive for joint pain.  Skin: Negative.   Neurological: Negative.   Endo/Heme/Allergies: Negative.   Psychiatric/Behavioral: The patient is nervous/anxious.       Objective:  Physical  Exam Constitutional:      Appearance: She is well-developed.  HENT:     Head: Normocephalic.  Eyes:     Pupils: Pupils are equal, round, and reactive to light.  Neck:     Thyroid: No thyromegaly.     Vascular: No JVD.     Trachea: No tracheal deviation.  Cardiovascular:     Rate and Rhythm: Normal rate and regular rhythm.     Pulses:  Intact distal pulses.  Pulmonary:     Effort: Pulmonary effort is normal. No respiratory distress.     Breath sounds: Normal breath sounds. No wheezing.  Abdominal:     Palpations: Abdomen is soft.     Tenderness: There is no abdominal tenderness. There is no guarding.  Musculoskeletal:     Cervical back: Neck supple.     Left knee: Swelling and bony tenderness present. No erythema or ecchymosis. Decreased range of motion. Tenderness present.  Lymphadenopathy:     Cervical: No cervical adenopathy.  Skin:    General: Skin is warm and dry.  Neurological:     Mental Status: She is alert and oriented to person, place, and time.  Psychiatric:        Mood and Affect: Mood and affect normal.       Labs:  Estimated body mass index is 32.88 kg/m as calculated from the following:   Height as of 09/26/19: 5\' 4"  (1.626 m).   Weight as of 09/26/19: 86.9 kg.   Imaging Review Plain radiographs demonstrate severe degenerative joint disease of the left knee.  The bone quality appears to be good for age and reported activity level.      Assessment/Plan:  End stage arthritis, left knee   The patient history, physical examination, clinical judgment of the provider and imaging studies are consistent with end stage degenerative joint disease of the left knee and total knee arthroplasty is deemed medically necessary. The treatment options including medical management, injection therapy arthroscopy and arthroplasty were discussed at length. The risks and benefits of total knee arthroplasty were presented and reviewed. The risks due to aseptic loosening,  infection, stiffness, patella tracking problems, thromboembolic complications and other imponderables were discussed. The patient acknowledged the explanation, agreed to proceed with the plan and consent was signed. Patient is being admitted for treatment for surgery, pain control, PT, OT, prophylactic antibiotics, VTE prophylaxis, progressive ambulation and ADL's and discharge planning. The patient is planning to be discharged home.     Patient's anticipated LOS is less than 2 midnights, meeting these requirements: - Lives within 1 hour of care - Has a competent adult at home to recover with post-op recover - NO history of  - Chronic pain requiring opiods  - Diabetes  - Coronary Artery Disease  - Heart failure  - Heart attack  - Stroke  - DVT/VTE  - Cardiac arrhythmia  - Respiratory Failure/COPD  - Renal failure  - Anemia  - Advanced Liver disease

## 2020-11-19 DIAGNOSIS — I10 Essential (primary) hypertension: Secondary | ICD-10-CM | POA: Diagnosis not present

## 2020-11-19 DIAGNOSIS — M797 Fibromyalgia: Secondary | ICD-10-CM | POA: Diagnosis not present

## 2020-11-19 DIAGNOSIS — R7303 Prediabetes: Secondary | ICD-10-CM | POA: Diagnosis not present

## 2020-11-19 DIAGNOSIS — M179 Osteoarthritis of knee, unspecified: Secondary | ICD-10-CM | POA: Diagnosis not present

## 2020-11-19 DIAGNOSIS — K219 Gastro-esophageal reflux disease without esophagitis: Secondary | ICD-10-CM | POA: Diagnosis not present

## 2020-11-19 DIAGNOSIS — F330 Major depressive disorder, recurrent, mild: Secondary | ICD-10-CM | POA: Diagnosis not present

## 2020-11-19 DIAGNOSIS — R7309 Other abnormal glucose: Secondary | ICD-10-CM | POA: Diagnosis not present

## 2020-11-19 DIAGNOSIS — Z0000 Encounter for general adult medical examination without abnormal findings: Secondary | ICD-10-CM | POA: Diagnosis not present

## 2020-11-19 DIAGNOSIS — E7800 Pure hypercholesterolemia, unspecified: Secondary | ICD-10-CM | POA: Diagnosis not present

## 2020-11-19 DIAGNOSIS — J309 Allergic rhinitis, unspecified: Secondary | ICD-10-CM | POA: Diagnosis not present

## 2020-11-19 DIAGNOSIS — R002 Palpitations: Secondary | ICD-10-CM | POA: Diagnosis not present

## 2020-11-25 ENCOUNTER — Other Ambulatory Visit: Payer: Self-pay

## 2020-11-25 ENCOUNTER — Encounter (HOSPITAL_COMMUNITY): Payer: Self-pay

## 2020-11-25 ENCOUNTER — Encounter (HOSPITAL_COMMUNITY)
Admission: RE | Admit: 2020-11-25 | Discharge: 2020-11-25 | Disposition: A | Discharge status: Home / Self Care | Payer: PPO | Source: Ambulatory Visit | Attending: Orthopedic Surgery | Admitting: Orthopedic Surgery

## 2020-11-25 DIAGNOSIS — Z01812 Encounter for preprocedural laboratory examination: Secondary | ICD-10-CM | POA: Diagnosis not present

## 2020-11-25 HISTORY — DX: Depression, unspecified: F32.A

## 2020-11-25 HISTORY — DX: Unspecified asthma, uncomplicated: J45.909

## 2020-11-25 LAB — BASIC METABOLIC PANEL
Anion gap: 10 (ref 5–15)
BUN: 14 mg/dL (ref 8–23)
CO2: 28 mmol/L (ref 22–32)
Calcium: 9.5 mg/dL (ref 8.9–10.3)
Chloride: 105 mmol/L (ref 98–111)
Creatinine, Ser: 0.81 mg/dL (ref 0.44–1.00)
GFR, Estimated: >60 mL/min (ref >60)
Glucose, Bld: 111 mg/dL — ABNORMAL HIGH (ref 70–99)
Potassium: 3.5 mmol/L (ref 3.5–5.1)
Sodium: 143 mmol/L (ref 135–145)

## 2020-11-25 LAB — CBC
HCT: 41.9 % (ref 36.0–46.0)
Hemoglobin: 13.2 g/dL (ref 12.0–15.0)
MCH: 27.7 pg (ref 26.0–34.0)
MCHC: 31.5 g/dL (ref 30.0–36.0)
MCV: 87.8 fL (ref 80.0–100.0)
Platelets: 361 10*3/uL (ref 150–400)
RBC: 4.77 MIL/uL (ref 3.87–5.11)
RDW: 14.2 % (ref 11.5–15.5)
WBC: 9.6 10*3/uL (ref 4.0–10.5)
nRBC: 0 % (ref 0.0–0.2)

## 2020-11-25 LAB — SURGICAL PCR SCREEN
MRSA, PCR: NEGATIVE
Staphylococcus aureus: NEGATIVE

## 2020-11-25 NOTE — Patient Instructions (Addendum)
DUE TO COVID-19 ONLY ONE VISITOR IS ALLOWED TO COME WITH YOU AND STAY IN THE WAITING ROOM ONLY DURING PRE OP AND PROCEDURE DAY OF SURGERY. THE 1 VISITOR  MAY VISIT WITH YOU AFTER SURGERY IN YOUR PRIVATE ROOM DURING VISITING HOURS ONLY!  YOU NEED TO HAVE A COVID 19 TEST ON: 12/02/20 @ 11:00 AM , THIS TEST MUST BE DONE BEFORE SURGERY,  COVID TESTING SITE [place] [address], IT IS ON THE RIGHT GOING OUT [address] APPROXIMATELY  2 MINUTES PAST [employer] ON THE RIGHT. ONCE YOUR COVID TEST IS COMPLETED,  PLEASE BEGIN THE QUARANTINE INSTRUCTIONS AS OUTLINED IN YOUR HANDOUT.                Donavan Burnet    Your procedure is scheduled on: 12/05/20   Report to [facility] Main  Entrance   Report to short stay at: 5:30 AM     Call this number if you have problems the morning of surgery [phone]    Remember: N O SOLID FOOD AFTER MIDNIGHT THE NIGHT PRIOR TO SURGERY. NOTHING BY MOUTH EXCEPT CLEAR LIQUIDS UNTIL: 4:15 AM . PLEASE FINISH ENSURE DRINK PER SURGEON ORDER  WHICH NEEDS TO BE COMPLETED AT: 4:15 AM .  CLEAR LIQUID DIET   Foods Allowed                                                                     Foods Excluded  Coffee and tea, regular and decaf                             liquids that you cannot  Plain Jell-O any favor except red or purple                                           see through such as: Fruit ices (not with fruit pulp)                                     milk, soups, orange juice  Iced Popsicles                                    All solid food Carbonated beverages, regular and diet                                    Cranberry, grape and apple juices Sports drinks like Gatorade Lightly seasoned clear broth or consume(fat free) Sugar, honey syrup  Sample Menu Breakfast                                Lunch  Supper Cranberry juice                    Beef broth                             Chicken broth Jell-O                                     Grape juice                           Apple juice Coffee or tea                        Jell-O                                      Popsicle                                                Coffee or tea                        Coffee or tea  _____________________________________________________________________   BRUSH YOUR TEETH MORNING OF SURGERY AND RINSE YOUR MOUTH OUT, NO CHEWING GUM CANDY OR MINTS.    Take these medicines the morning of surgery with A SIP OF WATER: Diltiazem,duloxetine,omeprazole.Use inhalers as usual.                               You may not have any metal on your body including hair pins and              piercings  Do not wear jewelry, make-up, lotions, powders or perfumes, deodorant             Do not wear nail polish on your fingernails.  Do not shave  48 hours prior to surgery.            Do not bring valuables to the hospital. [place].  Contacts, dentures or bridgework may not be worn into surgery.  Leave suitcase in the car. After surgery it may be brought to your room.     Patients discharged the day of surgery will not be allowed to drive home. IF YOU ARE HAVING SURGERY AND GOING HOME THE SAME DAY, YOU MUST HAVE AN ADULT TO DRIVE YOU HOME AND BE WITH YOU FOR 24 HOURS. YOU MAY GO HOME BY TAXI OR UBER OR ORTHERWISE, BUT AN ADULT MUST ACCOMPANY YOU HOME AND STAY WITH YOU FOR 24 HOURS.  Name and phone number of your driver:  Special Instructions: N/A              Please read over the following fact sheets you were given: _____________________________________________________________________         [facility] - Preparing for Surgery Before surgery, you can play an important role.  Because skin is not sterile, your skin needs  to be as free of germs as possible.  You can reduce the number of germs on your skin by washing with CHG (chlorahexidine  gluconate) soap before surgery.  CHG is an antiseptic cleaner which kills germs and bonds with the skin to continue killing germs even after washing. Please DO NOT use if you have an allergy to CHG or antibacterial soaps.  If your skin becomes reddened/irritated stop using the CHG and inform your nurse when you arrive at Short Stay. Do not shave (including legs and underarms) for at least 48 hours prior to the first CHG shower.  You may shave your face/neck. Please follow these instructions carefully:  1.  Shower with CHG Soap the night before surgery and the  morning of Surgery.  2.  If you choose to wash your hair, wash your hair first as usual with your  normal  shampoo.  3.  After you shampoo, rinse your hair and body thoroughly to remove the  shampoo.                           4.  Use CHG as you would any other liquid soap.  You can apply chg directly  to the skin and wash                       Gently with a scrungie or clean washcloth.  5.  Apply the CHG Soap to your body ONLY FROM THE NECK DOWN.   Do not use on face/ open                           Wound or open sores. Avoid contact with eyes, ears mouth and genitals (private parts).                       Wash face,  Genitals (private parts) with your normal soap.             6.  Wash thoroughly, paying special attention to the area where your surgery  will be performed.  7.  Thoroughly rinse your body with warm water from the neck down.  8.  DO NOT shower/wash with your normal soap after using and rinsing off  the CHG Soap.                9.  Pat yourself dry with a clean towel.            10.  Wear clean pajamas.            11.  Place clean sheets on your bed the night of your first shower and do not  sleep with pets. Day of Surgery : Do not apply any lotions/deodorants the morning of surgery.  Please wear clean clothes to the hospital/surgery center.  FAILURE TO FOLLOW THESE INSTRUCTIONS MAY RESULT IN THE CANCELLATION OF YOUR  SURGERY PATIENT SIGNATURE_________________________________  NURSE SIGNATURE__________________________________  ________________________________________________________________________   [name]  An incentive spirometer is a tool that can help keep your lungs clear and active. This tool measures how well you are filling your lungs with each breath. Taking long deep breaths may help reverse or decrease the chance of developing breathing (pulmonary) problems (especially infection) following:  A long period of time when you are unable to move or be active. BEFORE THE PROCEDURE   If the spirometer includes an indicator to show  your best effort, your nurse or respiratory therapist will set it to a desired goal.  If possible, sit up straight or lean slightly forward. Try not to slouch.  Hold the incentive spirometer in an upright position. INSTRUCTIONS FOR USE  1. Sit on the edge of your bed if possible, or sit up as far as you can in bed or on a chair. 2. Hold the incentive spirometer in an upright position. 3. Breathe out normally. 4. Place the mouthpiece in your mouth and seal your lips tightly around it. 5. Breathe in slowly and as deeply as possible, raising the piston or the ball toward the top of the column. 6. Hold your breath for 3-5 seconds or for as long as possible. Allow the piston or ball to fall to the bottom of the column. 7. Remove the mouthpiece from your mouth and breathe out normally. 8. Rest for a few seconds and repeat Steps 1 through 7 at least 10 times every 1-2 hours when you are awake. Take your time and take a few normal breaths between deep breaths. 9. The spirometer may include an indicator to show your best effort. Use the indicator as a goal to work toward during each repetition. 10. After each set of 10 deep breaths, practice coughing to be sure your lungs are clear. If you have an incision (the cut made at the time of surgery), support your  incision when coughing by placing a pillow or rolled up towels firmly against it. Once you are able to get out of bed, walk around indoors and cough well. You may stop using the incentive spirometer when instructed by your caregiver.  RISKS AND COMPLICATIONS  Take your time so you do not get dizzy or light-headed.  If you are in pain, you may need to take or ask for pain medication before doing incentive spirometry. It is harder to take a deep breath if you are having pain. AFTER USE  Rest and breathe slowly and easily.  It can be helpful to keep track of a log of your progress. Your caregiver can provide you with a simple table to help with this. If you are using the spirometer at home, follow these instructions: [place] IF:   You are having difficultly using the spirometer.  You have trouble using the spirometer as often as instructed.  Your pain medication is not giving enough relief while using the spirometer.  You develop fever of 100.5 F (38.1 C) or higher. SEEK IMMEDIATE MEDICAL CARE IF:   You cough up bloody sputum that had not been present before.  You develop fever of 102 F (38.9 C) or greater.  You develop worsening pain at or near the incision site. MAKE SURE YOU:   Understand these instructions.  Will watch your condition.  Will get help right away if you are not doing well or get worse. Document Released: 04/05/2007 Document Revised: 02/15/2012 Document Reviewed: 06/06/2007 [facility] Patient Information 2014 [place], [place].   ________________________________________________________________________

## 2020-11-25 NOTE — Progress Notes (Signed)
COVID Vaccine Completed: Yes Date COVID Vaccine completed: 11/26/20 [place] COVID vaccine manufacturer:     Moderna     PCP - Dr. Wenda Low. Cardiologist -   Chest x-ray -  EKG - 11/20/20. 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 Patient was also instructed that they will need to review over the PAT instructions again at home before surgery.

## 2020-11-27 DIAGNOSIS — M7541 Impingement syndrome of right shoulder: Secondary | ICD-10-CM | POA: Diagnosis not present

## 2020-12-02 ENCOUNTER — Other Ambulatory Visit (HOSPITAL_COMMUNITY)
Admission: RE | Admit: 2020-12-02 | Discharge: 2020-12-02 | Disposition: A | Discharge status: Home / Self Care | Payer: PPO | Source: Ambulatory Visit | Attending: Orthopaedic Surgery | Admitting: Orthopaedic Surgery

## 2020-12-02 NOTE — Progress Notes (Signed)
Pt. Was notified about time change for her surgery on 12/03/20. Pt is aware of eras time: 9:50 am,also to arrived to the hospital at: 10:20 am.

## 2020-12-03 ENCOUNTER — Observation Stay (HOSPITAL_COMMUNITY)
Admission: RE | Admit: 2020-12-03 | Discharge: 2020-12-04 | Disposition: A | Discharge status: Home / Self Care | Payer: PPO | Attending: Orthopedic Surgery | Admitting: Orthopedic Surgery

## 2020-12-03 ENCOUNTER — Ambulatory Visit (HOSPITAL_COMMUNITY): Payer: PPO | Admitting: Physician Assistant

## 2020-12-03 ENCOUNTER — Encounter (HOSPITAL_COMMUNITY)
Admission: RE | Disposition: A | Discharge status: Home / Self Care | Payer: Self-pay | Source: Home / Self Care | Attending: Orthopedic Surgery

## 2020-12-03 ENCOUNTER — Ambulatory Visit (HOSPITAL_COMMUNITY): Payer: PPO | Admitting: Anesthesiology

## 2020-12-03 ENCOUNTER — Other Ambulatory Visit: Payer: Self-pay

## 2020-12-03 ENCOUNTER — Encounter (HOSPITAL_COMMUNITY): Payer: Self-pay | Admitting: Orthopedic Surgery

## 2020-12-03 DIAGNOSIS — Z96651 Presence of right artificial knee joint: Secondary | ICD-10-CM | POA: Insufficient documentation

## 2020-12-03 DIAGNOSIS — Z87891 Personal history of nicotine dependence: Secondary | ICD-10-CM | POA: Insufficient documentation

## 2020-12-03 DIAGNOSIS — M1712 Unilateral primary osteoarthritis, left knee: Secondary | ICD-10-CM | POA: Diagnosis not present

## 2020-12-03 DIAGNOSIS — J45909 Unspecified asthma, uncomplicated: Secondary | ICD-10-CM | POA: Diagnosis not present

## 2020-12-03 DIAGNOSIS — Z96652 Presence of left artificial knee joint: Secondary | ICD-10-CM

## 2020-12-03 DIAGNOSIS — M25762 Osteophyte, left knee: Secondary | ICD-10-CM | POA: Diagnosis not present

## 2020-12-03 DIAGNOSIS — G8918 Other acute postprocedural pain: Secondary | ICD-10-CM | POA: Diagnosis not present

## 2020-12-03 DIAGNOSIS — M25462 Effusion, left knee: Secondary | ICD-10-CM | POA: Diagnosis not present

## 2020-12-03 DIAGNOSIS — Z20822 Contact with and (suspected) exposure to covid-19: Secondary | ICD-10-CM | POA: Insufficient documentation

## 2020-12-03 DIAGNOSIS — I10 Essential (primary) hypertension: Secondary | ICD-10-CM | POA: Diagnosis not present

## 2020-12-03 DIAGNOSIS — Z79899 Other long term (current) drug therapy: Secondary | ICD-10-CM | POA: Insufficient documentation

## 2020-12-03 DIAGNOSIS — Z96641 Presence of right artificial hip joint: Secondary | ICD-10-CM | POA: Diagnosis not present

## 2020-12-03 DIAGNOSIS — M25562 Pain in left knee: Secondary | ICD-10-CM | POA: Diagnosis present

## 2020-12-03 HISTORY — PX: TOTAL KNEE ARTHROPLASTY: SHX125

## 2020-12-03 LAB — RESP PANEL BY RT-PCR (FLU A&B, COVID) ARPGX2
Influenza A by PCR: NEGATIVE
Influenza B by PCR: NEGATIVE
SARS Coronavirus 2 by RT PCR: NEGATIVE

## 2020-12-03 SURGERY — ARTHROPLASTY, KNEE, TOTAL
Anesthesia: Spinal | Site: Knee | Laterality: Left

## 2020-12-03 MED ORDER — FENTANYL CITRATE (PF) 100 MCG/2ML IJ SOLN
INTRAMUSCULAR | Status: AC
  Filled 2020-12-03: qty 2

## 2020-12-03 MED ORDER — METHOCARBAMOL 500 MG PO TABS
500.0000 mg | ORAL_TABLET | Freq: Four times a day (QID) | ORAL | Status: DC | PRN
  Administered 2020-12-03 – 2020-12-04 (×3): 500 mg via ORAL
  Filled 2020-12-03 (×3): qty 1

## 2020-12-03 MED ORDER — DEXAMETHASONE SODIUM PHOSPHATE 10 MG/ML IJ SOLN
10.0000 mg | Freq: Once | INTRAMUSCULAR | Status: AC
  Administered 2020-12-03: 10 mg via INTRAVENOUS

## 2020-12-03 MED ORDER — MIDAZOLAM HCL 5 MG/5ML IJ SOLN
INTRAMUSCULAR | Status: DC | PRN
  Administered 2020-12-03 (×2): .5 mg via INTRAVENOUS

## 2020-12-03 MED ORDER — ORAL CARE MOUTH RINSE: 15.0000 mL | Freq: Once | OROMUCOSAL | Status: AC

## 2020-12-03 MED ORDER — ALUM & MAG HYDROXIDE-SIMETH 200-200-20 MG/5ML PO SUSP: 15.0000 mL | ORAL | Status: DC | PRN

## 2020-12-03 MED ORDER — ROPIVACAINE HCL 7.5 MG/ML IJ SOLN
INTRAMUSCULAR | Status: DC | PRN
  Administered 2020-12-03: 20 mL via PERINEURAL

## 2020-12-03 MED ORDER — DILTIAZEM HCL ER COATED BEADS 180 MG PO CP24
180.0000 mg | ORAL_CAPSULE | Freq: Every morning | ORAL | Status: DC
  Administered 2020-12-04: 09:00:00 180 mg via ORAL
  Filled 2020-12-03: qty 1

## 2020-12-03 MED ORDER — METOCLOPRAMIDE HCL 5 MG PO TABS: 5.0000 mg | ORAL_TABLET | Freq: Three times a day (TID) | ORAL | Status: DC | PRN

## 2020-12-03 MED ORDER — FERROUS SULFATE 325 (65 FE) MG PO TABS
325.0000 mg | ORAL_TABLET | Freq: Two times a day (BID) | ORAL | Status: DC
  Administered 2020-12-03 – 2020-12-04 (×2): 325 mg via ORAL
  Filled 2020-12-03 (×2): qty 1

## 2020-12-03 MED ORDER — PROPOFOL 500 MG/50ML IV EMUL
INTRAVENOUS | Status: DC | PRN
  Administered 2020-12-03: 50 ug/kg/min via INTRAVENOUS

## 2020-12-03 MED ORDER — LIDOCAINE HCL (CARDIAC) PF 100 MG/5ML IV SOSY
PREFILLED_SYRINGE | INTRAVENOUS | Status: DC | PRN
  Administered 2020-12-03: 40 mg via INTRAVENOUS

## 2020-12-03 MED ORDER — KETOROLAC TROMETHAMINE 30 MG/ML IJ SOLN
INTRAMUSCULAR | Status: DC | PRN
  Administered 2020-12-03: 30 mg

## 2020-12-03 MED ORDER — PANTOPRAZOLE SODIUM 40 MG PO TBEC
40.0000 mg | DELAYED_RELEASE_TABLET | Freq: Every day | ORAL | Status: DC
  Administered 2020-12-04: 09:00:00 40 mg via ORAL
  Filled 2020-12-03: qty 1

## 2020-12-03 MED ORDER — LISINOPRIL 10 MG PO TABS
10.0000 mg | ORAL_TABLET | Freq: Every day | ORAL | Status: DC
  Administered 2020-12-04: 09:00:00 10 mg via ORAL
  Filled 2020-12-03: qty 1

## 2020-12-03 MED ORDER — ONDANSETRON HCL 4 MG PO TABS: 4.0000 mg | ORAL_TABLET | Freq: Four times a day (QID) | ORAL | Status: DC | PRN

## 2020-12-03 MED ORDER — PROPOFOL 10 MG/ML IV BOLUS
INTRAVENOUS | Status: AC
  Filled 2020-12-03: qty 20

## 2020-12-03 MED ORDER — DIPHENHYDRAMINE HCL 12.5 MG/5ML PO ELIX: 12.5000 mg | ORAL_SOLUTION | ORAL | Status: DC | PRN

## 2020-12-03 MED ORDER — CHLORHEXIDINE GLUCONATE 0.12 % MT SOLN
15.0000 mL | Freq: Once | OROMUCOSAL | Status: AC
  Administered 2020-12-03: 10:00:00 15 mL via OROMUCOSAL

## 2020-12-03 MED ORDER — PHENOL 1.4 % MT LIQD: 1.0000 | OROMUCOSAL | Status: DC | PRN

## 2020-12-03 MED ORDER — SODIUM CHLORIDE 0.9 % IV SOLN: INTRAVENOUS | Status: DC

## 2020-12-03 MED ORDER — BUPIVACAINE-EPINEPHRINE (PF) 0.25% -1:200000 IJ SOLN
INTRAMUSCULAR | Status: AC
  Filled 2020-12-03: qty 30

## 2020-12-03 MED ORDER — LACTATED RINGERS IV SOLN: INTRAVENOUS | Status: DC

## 2020-12-03 MED ORDER — SIMVASTATIN 20 MG PO TABS
20.0000 mg | ORAL_TABLET | Freq: Every evening | ORAL | Status: DC
  Administered 2020-12-03: 18:00:00 20 mg via ORAL
  Filled 2020-12-03: qty 1

## 2020-12-03 MED ORDER — MEPERIDINE HCL 50 MG/ML IJ SOLN: 6.2500 mg | INTRAMUSCULAR | Status: DC | PRN

## 2020-12-03 MED ORDER — METHOCARBAMOL 1000 MG/10ML IJ SOLN
500.0000 mg | Freq: Four times a day (QID) | INTRAVENOUS | Status: DC | PRN
  Filled 2020-12-03: qty 5

## 2020-12-03 MED ORDER — SODIUM CHLORIDE (PF) 0.9 % IJ SOLN
INTRAMUSCULAR | Status: AC
  Filled 2020-12-03: qty 30

## 2020-12-03 MED ORDER — ACETAMINOPHEN 500 MG PO TABS: 1000.0000 mg | ORAL_TABLET | Freq: Once | ORAL | Status: AC

## 2020-12-03 MED ORDER — TRANEXAMIC ACID-NACL 1000-0.7 MG/100ML-% IV SOLN
1000.0000 mg | INTRAVENOUS | Status: AC
  Administered 2020-12-03: 1000 mg via INTRAVENOUS
  Filled 2020-12-03: qty 100

## 2020-12-03 MED ORDER — METOCLOPRAMIDE HCL 5 MG/ML IJ SOLN: 5.0000 mg | Freq: Three times a day (TID) | INTRAMUSCULAR | Status: DC | PRN

## 2020-12-03 MED ORDER — DEXAMETHASONE SODIUM PHOSPHATE 10 MG/ML IJ SOLN
INTRAMUSCULAR | Status: AC
  Filled 2020-12-03: qty 1

## 2020-12-03 MED ORDER — CELECOXIB 200 MG PO CAPS
200.0000 mg | ORAL_CAPSULE | Freq: Two times a day (BID) | ORAL | Status: DC
  Administered 2020-12-03 – 2020-12-04 (×2): 200 mg via ORAL
  Filled 2020-12-03 (×2): qty 1

## 2020-12-03 MED ORDER — MORPHINE SULFATE (PF) 2 MG/ML IV SOLN
0.5000 mg | INTRAVENOUS | Status: DC | PRN
Start: 2020-12-03 — End: 2020-12-04
  Administered 2020-12-04: 1 mg via INTRAVENOUS
  Filled 2020-12-03: qty 1

## 2020-12-03 MED ORDER — CEFAZOLIN SODIUM-DEXTROSE 2-4 GM/100ML-% IV SOLN
2.0000 g | Freq: Four times a day (QID) | INTRAVENOUS | Status: AC
  Administered 2020-12-03 (×2): 2 g via INTRAVENOUS
  Filled 2020-12-03 (×2): qty 100

## 2020-12-03 MED ORDER — LIDOCAINE HCL (PF) 2 % IJ SOLN
INTRAMUSCULAR | Status: AC
  Filled 2020-12-03: qty 5

## 2020-12-03 MED ORDER — PROPOFOL 500 MG/50ML IV EMUL
INTRAVENOUS | Status: AC
  Filled 2020-12-03: qty 50

## 2020-12-03 MED ORDER — ONDANSETRON HCL 4 MG/2ML IJ SOLN
INTRAMUSCULAR | Status: AC
  Filled 2020-12-03: qty 2

## 2020-12-03 MED ORDER — MAGNESIUM CITRATE PO SOLN: 1.0000 | Freq: Once | ORAL | Status: DC | PRN

## 2020-12-03 MED ORDER — FENTANYL CITRATE (PF) 100 MCG/2ML IJ SOLN
INTRAMUSCULAR | Status: DC | PRN
  Administered 2020-12-03 (×2): 25 ug via INTRAVENOUS

## 2020-12-03 MED ORDER — KETOROLAC TROMETHAMINE 30 MG/ML IJ SOLN
INTRAMUSCULAR | Status: AC
  Filled 2020-12-03: qty 1

## 2020-12-03 MED ORDER — ACETAMINOPHEN 500 MG PO TABS
ORAL_TABLET | ORAL | Status: AC
  Administered 2020-12-03: 12:00:00 1000 mg via ORAL
  Filled 2020-12-03: qty 2

## 2020-12-03 MED ORDER — DOCUSATE SODIUM 100 MG PO CAPS
100.0000 mg | ORAL_CAPSULE | Freq: Two times a day (BID) | ORAL | Status: DC
  Administered 2020-12-03 – 2020-12-04 (×2): 100 mg via ORAL
  Filled 2020-12-03 (×2): qty 1

## 2020-12-03 MED ORDER — BISACODYL 10 MG RE SUPP: 10.0000 mg | Freq: Every day | RECTAL | Status: DC | PRN

## 2020-12-03 MED ORDER — ALBUTEROL SULFATE HFA 108 (90 BASE) MCG/ACT IN AERS: 2.0000 | INHALATION_SPRAY | Freq: Four times a day (QID) | RESPIRATORY_TRACT | Status: DC | PRN

## 2020-12-03 MED ORDER — POLYETHYLENE GLYCOL 3350 17 G PO PACK
17.0000 g | PACK | Freq: Two times a day (BID) | ORAL | Status: DC
  Administered 2020-12-03 – 2020-12-04 (×2): 17 g via ORAL
  Filled 2020-12-03 (×2): qty 1

## 2020-12-03 MED ORDER — PROMETHAZINE HCL 25 MG/ML IJ SOLN: 6.2500 mg | INTRAMUSCULAR | Status: DC | PRN

## 2020-12-03 MED ORDER — ONDANSETRON HCL 4 MG/2ML IJ SOLN: 4.0000 mg | Freq: Four times a day (QID) | INTRAMUSCULAR | Status: DC | PRN

## 2020-12-03 MED ORDER — ASPIRIN 81 MG PO CHEW
81.0000 mg | CHEWABLE_TABLET | Freq: Two times a day (BID) | ORAL | Status: DC
  Administered 2020-12-03 – 2020-12-04 (×2): 81 mg via ORAL
  Filled 2020-12-03 (×2): qty 1

## 2020-12-03 MED ORDER — ONDANSETRON HCL 4 MG/2ML IJ SOLN
INTRAMUSCULAR | Status: DC | PRN
  Administered 2020-12-03: 4 mg via INTRAVENOUS

## 2020-12-03 MED ORDER — STERILE WATER FOR IRRIGATION IR SOLN
Status: DC | PRN
  Administered 2020-12-03: 1000 mL

## 2020-12-03 MED ORDER — 0.9 % SODIUM CHLORIDE (POUR BTL) OPTIME
TOPICAL | Status: DC | PRN
  Administered 2020-12-03: 13:00:00 1000 mL

## 2020-12-03 MED ORDER — MIDAZOLAM HCL 2 MG/2ML IJ SOLN
INTRAMUSCULAR | Status: AC
  Filled 2020-12-03: qty 2

## 2020-12-03 MED ORDER — CEFAZOLIN SODIUM-DEXTROSE 2-4 GM/100ML-% IV SOLN
2.0000 g | INTRAVENOUS | Status: AC
  Administered 2020-12-03: 2 g via INTRAVENOUS
  Filled 2020-12-03: qty 100

## 2020-12-03 MED ORDER — HYDROCODONE-ACETAMINOPHEN 7.5-325 MG PO TABS
1.0000 | ORAL_TABLET | ORAL | Status: DC | PRN
  Administered 2020-12-04: 2 via ORAL
  Administered 2020-12-04: 1 via ORAL
  Administered 2020-12-04: 2 via ORAL
  Filled 2020-12-03 (×2): qty 2
  Filled 2020-12-03: qty 1

## 2020-12-03 MED ORDER — FENTANYL CITRATE (PF) 100 MCG/2ML IJ SOLN
INTRAMUSCULAR | Status: AC
  Administered 2020-12-03: 12:00:00 50 ug
  Filled 2020-12-03: qty 2

## 2020-12-03 MED ORDER — HYDRALAZINE HCL 20 MG/ML IJ SOLN
INTRAMUSCULAR | Status: DC | PRN
  Administered 2020-12-03: 2.5 mg via INTRAVENOUS

## 2020-12-03 MED ORDER — BUPIVACAINE-EPINEPHRINE (PF) 0.25% -1:200000 IJ SOLN
INTRAMUSCULAR | Status: DC | PRN
  Administered 2020-12-03: 30 mL

## 2020-12-03 MED ORDER — DULOXETINE HCL 60 MG PO CPEP
60.0000 mg | ORAL_CAPSULE | Freq: Every day | ORAL | Status: DC
  Administered 2020-12-04: 09:00:00 60 mg via ORAL
  Filled 2020-12-03: qty 1

## 2020-12-03 MED ORDER — MIDAZOLAM HCL 2 MG/2ML IJ SOLN
INTRAMUSCULAR | Status: AC
  Administered 2020-12-03: 12:00:00 1 mg
  Filled 2020-12-03: qty 2

## 2020-12-03 MED ORDER — POVIDONE-IODINE 10 % EX SWAB
2.0000 "application " | Freq: Once | CUTANEOUS | Status: AC
  Administered 2020-12-03: 2 via TOPICAL

## 2020-12-03 MED ORDER — MIDAZOLAM HCL 2 MG/2ML IJ SOLN: 0.5000 mg | Freq: Once | INTRAMUSCULAR | Status: DC | PRN

## 2020-12-03 MED ORDER — TRANEXAMIC ACID-NACL 1000-0.7 MG/100ML-% IV SOLN
1000.0000 mg | Freq: Once | INTRAVENOUS | Status: AC
  Administered 2020-12-03: 18:00:00 1000 mg via INTRAVENOUS
  Filled 2020-12-03: qty 100

## 2020-12-03 MED ORDER — SODIUM CHLORIDE (PF) 0.9 % IJ SOLN
INTRAMUSCULAR | Status: DC | PRN
  Administered 2020-12-03: 30 mL

## 2020-12-03 MED ORDER — MENTHOL 3 MG MT LOZG: 1.0000 | LOZENGE | OROMUCOSAL | Status: DC | PRN

## 2020-12-03 MED ORDER — ACETAMINOPHEN 325 MG PO TABS: 325.0000 mg | ORAL_TABLET | Freq: Four times a day (QID) | ORAL | Status: DC | PRN

## 2020-12-03 MED ORDER — ATORVASTATIN CALCIUM 10 MG PO TABS: 10.0000 mg | ORAL_TABLET | Freq: Every day | ORAL | Status: DC

## 2020-12-03 MED ORDER — HYDROCODONE-ACETAMINOPHEN 5-325 MG PO TABS
1.0000 | ORAL_TABLET | ORAL | Status: DC | PRN
  Administered 2020-12-03 (×2): 1 via ORAL
  Administered 2020-12-04: 2 via ORAL
  Filled 2020-12-03: qty 2
  Filled 2020-12-03: qty 1
  Filled 2020-12-03: qty 2
  Filled 2020-12-03: qty 1

## 2020-12-03 MED ORDER — DEXAMETHASONE SODIUM PHOSPHATE 10 MG/ML IJ SOLN
10.0000 mg | Freq: Once | INTRAMUSCULAR | Status: AC
  Administered 2020-12-04: 09:00:00 10 mg via INTRAVENOUS
  Filled 2020-12-03: qty 1

## 2020-12-03 SURGICAL SUPPLY — 64 items
ADH SKN CLS APL DERMABOND .7 (GAUZE/BANDAGES/DRESSINGS) ×1
ATTUNE MED ANAT PAT 35 KNEE (Knees) ×1 IMPLANT
ATTUNE MED ANAT PAT 35MM KNEE (Knees) ×1 IMPLANT
ATTUNE PSFEM LTSZ5 NARCEM KNEE (Femur) ×2 IMPLANT
ATTUNE PSRP INSE SZ 5 7MM KNEE (Insert) ×1 IMPLANT
ATTUNE PSRP INSE SZ5 7 KNEE (Insert) ×1 IMPLANT
BAG SPEC THK2 15X12 ZIP CLS (MISCELLANEOUS)
BAG ZIPLOCK 12X15 (MISCELLANEOUS) IMPLANT
BASE TIBIAL ROT PLAT SZ 5 KNEE (Knees) IMPLANT
BLADE SAW SGTL 11.0X1.19X90.0M (BLADE) IMPLANT
BLADE SAW SGTL 13.0X1.19X90.0M (BLADE) ×3 IMPLANT
BLADE SURG SZ10 CARB STEEL (BLADE) ×6 IMPLANT
BNDG CMPR MED 10X6 ELC LF (GAUZE/BANDAGES/DRESSINGS) ×1
BNDG ELASTIC 6X10 VLCR STRL LF (GAUZE/BANDAGES/DRESSINGS) ×2 IMPLANT
BNDG ELASTIC 6X5.8 VLCR STR LF (GAUZE/BANDAGES/DRESSINGS) ×3 IMPLANT
BOWL SMART MIX CTS (DISPOSABLE) ×3 IMPLANT
BSPLAT TIB 5 CMNT ROT PLAT STR (Knees) ×1 IMPLANT
CEMENT HV SMART SET (Cement) ×4 IMPLANT
COVER WAND RF STERILE (DRAPES) IMPLANT
CUFF TOURN SGL QUICK 34 (TOURNIQUET CUFF) ×3
CUFF TRNQT CYL 34X4.125X (TOURNIQUET CUFF) ×1 IMPLANT
DECANTER SPIKE VIAL GLASS SM (MISCELLANEOUS) ×6 IMPLANT
DERMABOND ADVANCED (GAUZE/BANDAGES/DRESSINGS) ×2
DERMABOND ADVANCED .7 DNX12 (GAUZE/BANDAGES/DRESSINGS) ×1 IMPLANT
DRAPE U-SHAPE 47X51 STRL (DRAPES) ×3 IMPLANT
DRESSING AQUACEL AG SP 3.5X10 (GAUZE/BANDAGES/DRESSINGS) ×1 IMPLANT
DRSG AQUACEL AG ADV 3.5X10 (GAUZE/BANDAGES/DRESSINGS) ×2 IMPLANT
DRSG AQUACEL AG SP 3.5X10 (GAUZE/BANDAGES/DRESSINGS) ×3
DURAPREP 26ML APPLICATOR (WOUND CARE) ×6 IMPLANT
ELECT REM PT RETURN 15FT ADLT (MISCELLANEOUS) ×3 IMPLANT
GLOVE BIOGEL PI IND STRL 7.5 (GLOVE) ×1 IMPLANT
GLOVE BIOGEL PI IND STRL 8.5 (GLOVE) IMPLANT
GLOVE BIOGEL PI INDICATOR 7.5 (GLOVE) ×2
GLOVE BIOGEL PI INDICATOR 8.5 (GLOVE)
GLOVE ECLIPSE 8.0 STRL XLNG CF (GLOVE) ×3 IMPLANT
GLOVE ORTHO TXT STRL SZ7.5 (GLOVE) ×3 IMPLANT
GLOVE SURG ENC MOIS LTX SZ6 (GLOVE) IMPLANT
GLOVE SURG UNDER POLY LF SZ6.5 (GLOVE) IMPLANT
GOWN STRL REUS W/TWL 2XL LVL3 (GOWN DISPOSABLE) IMPLANT
GOWN STRL REUS W/TWL LRG LVL3 (GOWN DISPOSABLE) ×3 IMPLANT
HANDPIECE INTERPULSE COAX TIP (DISPOSABLE) ×3
HOLDER FOLEY CATH W/STRAP (MISCELLANEOUS) IMPLANT
KIT TURNOVER KIT A (KITS) IMPLANT
MANIFOLD NEPTUNE II (INSTRUMENTS) ×3 IMPLANT
NDL SAFETY ECLIPSE 18X1.5 (NEEDLE) IMPLANT
NEEDLE HYPO 18GX1.5 SHARP (NEEDLE)
NS IRRIG 1000ML POUR BTL (IV SOLUTION) ×3 IMPLANT
PACK TOTAL KNEE CUSTOM (KITS) ×3 IMPLANT
PENCIL SMOKE EVACUATOR (MISCELLANEOUS) IMPLANT
PIN DRILL FIX HALF THREAD (BIT) ×2 IMPLANT
PIN FIX SIGMA LCS THRD HI (PIN) ×2 IMPLANT
PROTECTOR NERVE ULNAR (MISCELLANEOUS) ×3 IMPLANT
SET HNDPC FAN SPRY TIP SCT (DISPOSABLE) ×1 IMPLANT
SET PAD KNEE POSITIONER (MISCELLANEOUS) ×3 IMPLANT
SUT MNCRL AB 4-0 PS2 18 (SUTURE) ×3 IMPLANT
SUT STRATAFIX PDS+ 0 24IN (SUTURE) ×3 IMPLANT
SUT VIC AB 1 CT1 36 (SUTURE) ×3 IMPLANT
SUT VIC AB 2-0 CT1 27 (SUTURE) ×9
SUT VIC AB 2-0 CT1 TAPERPNT 27 (SUTURE) ×3 IMPLANT
SYR 3ML LL SCALE MARK (SYRINGE) ×3 IMPLANT
TIBIAL BASE ROT PLAT SZ 5 KNEE (Knees) ×3 IMPLANT
TRAY FOLEY MTR SLVR 16FR STAT (SET/KITS/TRAYS/PACK) ×3 IMPLANT
WATER STERILE IRR 1000ML POUR (IV SOLUTION) ×6 IMPLANT
WRAP KNEE MAXI GEL POST OP (GAUZE/BANDAGES/DRESSINGS) ×3 IMPLANT

## 2020-12-03 NOTE — Progress Notes (Signed)
The order for simvastatin(Zocor) 20mg  was changed to an equivalent dose of atorvastatin(Lipitor) 10mg  due to the potential drug interaction with Diltiazem  When taken in combination with medications that inhibit its metabolism, simvastatin can accumulate which increases the risk of liver toxicity, myopathy, or rhabdomyolysis.  Simvastatin dose should not exceed 10mg /day in patients taking verapamil, diltiazem, fibrates, or niacin >or= 1g/day.   Please consider this potential interaction at discharge.  PharmD, BCPS Clinical Pharmacist [facility] main pharmacy [phone] 12/03/2020 9:28 PM

## 2020-12-03 NOTE — Evaluation (Signed)
Physical Therapy Evaluation Patient Details Name: [name] MRN: [id number] DOB: 1950-12-16 Today's Date: 12/03/2020   History of Present Illness  Patient is 69 y.o. 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    Clinical Impression  [name] is a 69 y.o. female POD 0 s/p Lt TKA. Patient reports independence with mobility at baseline. Patient is now limited by functional impairments (see PT problem list below) and requires min assist for transfers with RW. Patient was able to step bed>chair with RW and min assist, no buckling at Lt knee however pt c/o of numbness in buttocks and feet. Patient instructed in exercise to facilitate circulation. Patient will benefit from continued skilled PT interventions to address impairments and progress towards PLOF. Acute PT will follow to progress mobility and stair training in preparation for safe discharge home.     Follow Up Recommendations Follow surgeon's recommendation for DC plan and follow-up therapies;Outpatient PT    Equipment Recommendations  None recommended by PT    Recommendations for Other Services       Precautions / Restrictions Precautions Precautions: Fall Restrictions Weight Bearing Restrictions: No Other Position/Activity Restrictions: WBAT      Mobility  Bed Mobility Overal bed mobility: Needs Assistance Bed Mobility: Supine to Sit     Supine to sit: Min assist;HOB elevated     General bed mobility comments: cues to use bed rail and assist with Lt LE off EOB.    Transfers Overall transfer level: Needs assistance Equipment used: Rolling walker (2 wheeled) Transfers: Sit to/from [employer] Sit to Stand: Min assist;From elevated surface Stand pivot transfers: Min assist       General transfer comment: cues for technique with power up from EOB, pt wtih slight anterior lean on rising and able  to steady self after several seconds. cues for step pattern and assist to turn RW and move to recliner. no buckling at Lt knee.  Ambulation/Gait                Stairs            Wheelchair Mobility    Modified Rankin (Stroke Patients Only)       Balance Overall balance assessment: Needs assistance Sitting-balance support: Feet supported Sitting balance-Leahy Scale: Good     Standing balance support: During functional activity;Bilateral upper extremity supported Standing balance-Leahy Scale: Fair                               Pertinent Vitals/Pain Pain Assessment: Faces Faces Pain Scale: Hurts a little bit Pain Location: Lt knee Pain Descriptors / Indicators: Aching;Discomfort Pain Intervention(s): Limited activity within patient's tolerance;Monitored during session;Repositioned;Ice applied    Home Living Family/patient expects to be discharged to:: Private residence Living Arrangements: Spouse/significant other Available Help at Discharge: Family Type of Home: House Home Access: Stairs to enter Entrance Stairs-Rails: None Entrance Stairs-Number of Steps: 1 Home Layout: One level Home Equipment: Environmental consultant - 2 wheels;Bedside commode;Cane - single point      Prior Function Level of Independence: Independent               Hand Dominance   Dominant Hand: Right    Extremity/Trunk Assessment   Upper Extremity Assessment Upper Extremity Assessment: Overall WFL for tasks assessed    Lower Extremity Assessment Lower Extremity Assessment: LLE deficits/detail  LLE Sensation: WNL LLE Coordination: WNL    Cervical / Trunk Assessment Cervical / Trunk Assessment: Normal  Communication   Communication: No difficulties  Cognition Arousal/Alertness: Awake/alert Behavior During Therapy: WFL for tasks assessed/performed Overall Cognitive Status: Within Functional Limits for tasks assessed                                         General Comments      Exercises Total Joint Exercises Ankle Circles/Pumps: AROM;Both;20 reps;Seated   Assessment/Plan    PT Assessment Patient needs continued PT services  PT Problem List Decreased strength;Decreased range of motion;Decreased activity tolerance;Decreased mobility;Decreased balance;Decreased knowledge of use of DME;Decreased knowledge of precautions       PT Treatment Interventions DME instruction;Gait training;Stair training;Functional mobility training;Therapeutic activities;Therapeutic exercise;Balance training;Patient/family education    PT Goals (Current goals can be found in the Care Plan section)  Acute Rehab PT Goals Patient Stated Goal: regain independence to keep up with her grandkids PT Goal Formulation: With patient Time For Goal Achievement: 12/10/20 Potential to Achieve Goals: Good    Frequency 7X/week   Barriers to discharge        Co-evaluation               AM-PAC PT "6 Clicks" Mobility  Outcome Measure Help needed turning from your back to your side while in a flat bed without using bedrails?: A Little Help needed moving from lying on your back to sitting on the side of a flat bed without using bedrails?: A Little Help needed moving to and from a bed to a chair (including a wheelchair)?: A Little Help needed standing up from a chair using your arms (e.g., wheelchair or bedside chair)?: A Little Help needed to walk in hospital room?: A Lot Help needed climbing 3-5 steps with a railing? : A Lot 6 Click Score: 16    End of Session Equipment Utilized During Treatment: Gait belt Activity Tolerance: Patient tolerated treatment well Patient left: in chair;with call bell/phone within reach;with chair alarm set;with family/visitor present Nurse Communication: Mobility status PT Visit Diagnosis: Muscle weakness (generalized) (M62.81);Difficulty in walking, not elsewhere classified (R26.2)    Time: [id number] PT Time Calculation  (min) (ACUTE ONLY): 18 min   Charges:   PT Evaluation $PT Eval Low Complexity: 1 Low          Verner Mould, DPT Acute Rehabilitation Services Office [phone] Pager [phone]    Jacques Navy 12/03/2020, 5:38 PM

## 2020-12-03 NOTE — Transfer of Care (Signed)
Immediate Anesthesia Transfer of Care Note  Patient: [name]  Procedure(s) Performed: TOTAL KNEE ARTHROPLASTY (Left Knee)  Patient Location: PACU  Anesthesia Type:Spinal  Level of Consciousness: awake, alert , oriented and patient cooperative  Airway & Oxygen Therapy: Patient Spontanous Breathing and Patient connected to face mask oxygen  Post-op Assessment: Report given to RN and Post -op Vital signs reviewed and stable  Post vital signs: Reviewed and stable  Last Vitals:  Vitals Value Taken Time  BP 137/67 12/03/20 1447  Temp    Pulse 59 12/03/20 1449  Resp 21 12/03/20 1449  SpO2 100 % 12/03/20 1449  Vitals shown include unvalidated device data.  Last Pain: There were no vitals filed for this visit.    Patients Stated Pain Goal: 3 (12/03/20 1014)  Complications: No complications documented.

## 2020-12-03 NOTE — Anesthesia Postprocedure Evaluation (Addendum)
Anesthesia Post Note  Patient: [name]  Procedure(s) Performed: TOTAL KNEE ARTHROPLASTY (Left Knee)     Patient location during evaluation: PACU Anesthesia Type: Spinal Level of consciousness: awake and alert, patient cooperative and oriented Pain management: pain level controlled Vital Signs Assessment: post-procedure vital signs reviewed and stable Respiratory status: nonlabored ventilation, respiratory function stable and spontaneous breathing Cardiovascular status: blood pressure returned to baseline and stable Postop Assessment: no apparent nausea or vomiting, patient able to bend at knees and spinal receding Anesthetic complications: no   No complications documented.  Last Vitals:  Vitals:   12/03/20 1530 12/03/20 1545  BP: 135/74 (!) 145/69  Pulse: (!) 59 (!) 57  Resp: 15 (!) 25  Temp:  36.9 C  SpO2: 100% 99%    Last Pain:  Vitals:   12/03/20 1545  PainSc: 0-No pain                 Thadius Smisek,E. Derico Mitton

## 2020-12-03 NOTE — Anesthesia Procedure Notes (Signed)
Spinal  Patient location during procedure: OB End time: 12/03/2020 12:55 PM Staffing Performed: anesthesiologist  Anesthesiologist: Jairo Ben, MD Preanesthetic Checklist Completed: patient identified, IV checked, site marked, risks and benefits discussed, surgical consent, monitors and equipment checked, pre-op evaluation and timeout performed Spinal Block Patient position: sitting Prep: DuraPrep and site prepped and draped Patient monitoring: blood pressure, continuous pulse ox, cardiac monitor and heart rate Approach: midline Location: L3-4 Injection technique: single-shot Needle Needle type: Pencan and Introducer  Needle gauge: 24 G Needle length: 9 cm Additional Notes Pt identified in Operating room.  Monitors applied. Working IV access confirmed. Sterile prep, drape lumbar spine.  1% lido local L 3,4 by CRNA, unsuccessful, additional local L 3,4 and  #24ga Pencan into clear CSF L 3,4.  12mg  0.75% Bupivacaine with dextrose injected with asp CSF beginning and end of injection.  Patient asymptomatic, VSS, no heme aspirated, tolerated well.  , MD

## 2020-12-03 NOTE — Anesthesia Procedure Notes (Signed)
Anesthesia Regional Block: Adductor canal block   Pre-Anesthetic Checklist: ,, timeout performed, Correct Patient, Correct Site, Correct Laterality, Correct Procedure, Correct Position, site marked, Risks and benefits discussed,  Surgical consent,  Pre-op evaluation,  At surgeon's request and post-op pain management  Laterality: Left and Lower  Prep: chloraprep       Needles:   Needle Type: Echogenic Needle     Needle Length: 9cm  Needle Gauge: 21     Additional Needles:   Procedures:,,,, ultrasound used (permanent image in chart),,,,  Narrative:  Start time: 12/03/2020 11:35 AM End time: 12/03/2020 11:42 AM Injection made incrementally with aspirations every 5 mL.  Performed by: Personally  Anesthesiologist: Jairo Ben, MD  Additional Notes: Pt identified in Holding room.  Monitors applied. Working IV access confirmed. Sterile prep L thigh.  #21ga ECHOgenic Arrow block needle into adductor canal with US guidance.  20cc 0.75% Ropivacaine injected incrementally after negative test dose.  Patient asymptomatic, VSS, no heme aspirated, tolerated well.  Sandford Craze, MD

## 2020-12-03 NOTE — Op Note (Signed)
NAME:  [name]                      MEDICAL RECORD NO.:  [id number]                             FACILITY:  [facility]      PHYSICIAN:  Pietro Cassis. Alvan Dame, M.D.  DATE OF BIRTH:  October 13, 1951      DATE OF PROCEDURE:  12/03/2020                                     OPERATIVE REPORT         PREOPERATIVE DIAGNOSIS:  Left knee osteoarthritis.      POSTOPERATIVE DIAGNOSIS:  Left knee osteoarthritis.      FINDINGS:  The patient was noted to have complete loss of cartilage and   bone-on-bone arthritis with associated osteophytes in the medial and patellofemoral compartments of   the knee with a significant synovitis and associated effusion.  The patient had failed months of conservative treatment including medications, injection therapy, activity modification.     PROCEDURE:  Left total knee replacement.      COMPONENTS USED:  DePuy Attune rotating platform posterior stabilized knee   system, a size 5N femur, 5 tibia, size 7 mm PS AOX insert, and 35 anatomic patellar   button.      SURGEON:  Pietro Cassis. Alvan Dame, M.D.      ASSISTANT:  Griffith Citron, PA-C.      ANESTHESIA:  Regional and Spinal.      SPECIMENS:  None.      COMPLICATION:  None.      DRAINS:  None.  EBL: <200cc      TOURNIQUET TIME:   Total Tourniquet Time Documented: Thigh (Left) - 37 minutes Total: Thigh (Left) - 37 minutes  .      The patient was stable to the recovery room.      INDICATION FOR PROCEDURE:  [name] is a 69 y.o. female patient of   mine.  The patient had been seen, evaluated, and treated for months conservatively in the   office with medication, activity modification, and injections.  The patient had   radiographic changes of bone-on-bone arthritis with endplate sclerosis and osteophytes noted.  Based on the radiographic changes and failed conservative measures, the patient   decided to proceed with definitive treatment, total knee replacement.  Risks of infection, DVT, component failure, need  for revision surgery, neurovascular injury were reviewed in the office setting.  The postop course was reviewed stressing the efforts to maximize post-operative satisfaction and function.  Consent was obtained for benefit of pain   relief.      PROCEDURE IN DETAIL:  The patient was brought to the operative theater.   Once adequate anesthesia, preoperative antibiotics, 2 gm of Ancef,1 gm of Tranexamic Acid, and 10 mg of Decadron administered, the patient was positioned supine with a left thigh tourniquet placed.  The  left lower extremity was prepped and draped in sterile fashion.  A time-   out was performed identifying the patient, planned procedure, and the appropriate extremity.      The left lower extremity was placed in the [facility] leg holder.  The leg was   exsanguinated, tourniquet elevated to 250 mmHg.  A midline incision was   made  followed by median parapatellar arthrotomy.  Following initial   exposure, attention was first directed to the patella.  Precut   measurement was noted to be 23 mm.  I resected down to 13-14 mm and used a   35 anatomic patellar button to restore patellar height as well as cover the cut surface.      The lug holes were drilled and a metal shim was placed to protect the   patella from retractors and saw blade during the procedure.      At this point, attention was now directed to the femur.  The femoral   canal was opened with a drill, irrigated to try to prevent fat emboli.  An   intramedullary rod was passed at 3 degrees valgus, 9 mm of bone was   resected off the distal femur.  Following this resection, the tibia was   subluxated anteriorly.  Using the extramedullary guide, 2 mm of bone was resected off   the proximal medial tibia.  We confirmed the gap would be   stable medially and laterally with a size 6 spacer block as well as confirmed that the tibial cut was perpendicular in the coronal plane, checking with an alignment rod.      Once this was  done, I sized the femur to be a size 5 in the anterior-   posterior dimension, chose a narrow component based on medial and   lateral dimension.  The size 5 rotation block was then pinned in   position anterior referenced using the C-clamp to set rotation.  The   anterior, posterior, and  chamfer cuts were made without difficulty nor   notching making certain that I was along the anterior cortex to help   with flexion gap stability.      The final box cut was made off the lateral aspect of distal femur.      At this point, the tibia was sized to be a size 5.  The size 5 tray was   then pinned in position through the medial third of the tubercle,   drilled, and keel punched.  Trial reduction was now carried with a 5 femur,  5 tibia, a size 7 mm PS insert, and the 35 anatomic patella botton.  The knee was brought to full extension with good flexion stability with the patella   tracking through the trochlea without application of pressure.  Given   all these findings the trial components removed.  Final components were   opened and cement was mixed.  The knee was irrigated with normal saline solution and pulse lavage.  The synovial lining was   then injected with 30 cc of 0.25% Marcaine with epinephrine, 1 cc of Toradol and 30 cc of NS for a total of 61 cc.     Final implants were then cemented onto cleaned and dried cut surfaces of bone with the knee brought to extension with a size 7 mm PS trial insert.      Once the cement had fully cured, excess cement was removed   throughout the knee.  I confirmed that I was satisfied with the range of   motion and stability, and the final size 7 mm PS AOX insert was chosen.  It was   placed into the knee.      The tourniquet had been let down at 37 minutes.  No significant   hemostasis was required.  The extensor mechanism was then reapproximated using #1 Vicryl and #  1 Stratafix sutures with the knee   in flexion.  The   remaining wound was closed  with 2-0 Vicryl and running 4-0 Monocryl.   The knee was cleaned, dried, dressed sterilely using Dermabond and   Aquacel dressing.  The patient was then   brought to recovery room in stable condition, tolerating the procedure   well.   Please note that Physician Assistant, Dennie Bible, PA-C was present for the entirety of the case, and was utilized for pre-operative positioning, peri-operative retractor management, general facilitation of the procedure and for primary wound closure at the end of the case.              Madlyn Frankel Charlann Boxer, M.D.    12/03/2020 3:39 PM

## 2020-12-03 NOTE — Plan of Care (Signed)
  Problem: Education: Goal: Knowledge of General Education information will improve Description: Including pain rating scale, medication(s)/side effects and non-pharmacologic comfort measures Outcome: Progressing   Problem: Clinical Measurements: Goal: Respiratory complications will improve Outcome: Progressing   Problem: Pain Managment: Goal: General experience of comfort will improve Outcome: Progressing   Problem: Safety: Goal: Ability to remain free from injury will improve Outcome: Progressing   Problem: Education: Goal: Knowledge of the prescribed therapeutic regimen will improve Outcome: Progressing   Problem: Activity: Goal: Ability to avoid complications of mobility impairment will improve Outcome: Progressing   Problem: Pain Management: Goal: Pain level will decrease with appropriate interventions Outcome: Progressing

## 2020-12-03 NOTE — Anesthesia Preprocedure Evaluation (Addendum)
Anesthesia Evaluation  Patient identified by MRN, date of birth, ID band Patient awake    Reviewed: Allergy & Precautions, NPO status , Patient's Chart, lab work & pertinent test results  History of Anesthesia Complications Negative for: history of anesthetic complications  Airway Mallampati: III  TM Distance: >3 FB Neck ROM: Full  Mouth opening: Limited Mouth Opening  Dental  (+) Caps, Dental Advisory Given   Pulmonary COPD,  COPD inhaler, former smoker,  12/03/2020 SARS coronavirus NEG   breath sounds clear to auscultation       Cardiovascular hypertension, Pt. on medications (-) angina Rhythm:Regular Rate:Normal  '10 ECHO: normal LV size and function, mild Aortic sclerosis without stenosis   Neuro/Psych Anxiety Depression negative neurological ROS     GI/Hepatic Neg liver ROS, GERD  Medicated and Controlled,  Endo/Other  obese  Renal/GU negative Renal ROS     Musculoskeletal   Abdominal (+) + obese,   Peds  Hematology negative hematology ROS (+)   Anesthesia Other Findings   Reproductive/Obstetrics                           Anesthesia Physical Anesthesia Plan  ASA: II  Anesthesia Plan: Spinal   Post-op Pain Management:  Regional for Post-op pain   Induction:   PONV Risk Score and Plan: 2 and Ondansetron  Airway Management Planned: Natural Airway and Simple Face Mask  Additional Equipment: None  Intra-op Plan:   Post-operative Plan:   Informed Consent: I have reviewed the patients History and Physical, chart, labs and discussed the procedure including the risks, benefits and alternatives for the proposed anesthesia with the patient or authorized representative who has indicated his/her understanding and acceptance.     Dental advisory given  Plan Discussed with: CRNA and Surgeon  Anesthesia Plan Comments: (Plan routine monitors, SAB with adductor canal block for post  op analgesia)      Anesthesia Quick Evaluation

## 2020-12-03 NOTE — Interval H&P Note (Signed)
History and Physical Interval Note:  12/03/2020 11:16 AM  [name]  has presented today for surgery, with the diagnosis of Left knee osteoarthritis.  The various methods of treatment have been discussed with the patient and family. After consideration of risks, benefits and other options for treatment, the patient has consented to  Procedure(s) with comments: TOTAL KNEE ARTHROPLASTY (Left) - as a surgical intervention.  The patient's history has been reviewed, patient examined, no change in status, stable for surgery.  I have reviewed the patient's chart and labs.  Questions were answered to the patient's satisfaction.     [name]

## 2020-12-03 NOTE — Discharge Instructions (Signed)

## 2020-12-03 NOTE — Progress Notes (Signed)
Assisted Dr. Carswell Jackson with left, ultrasound guided, adductor canal block. Side rails up, monitors on throughout procedure. See vital signs in flow sheet. Tolerated Procedure well.  

## 2020-12-04 ENCOUNTER — Other Ambulatory Visit (HOSPITAL_COMMUNITY): Payer: Self-pay | Admitting: Student

## 2020-12-04 ENCOUNTER — Encounter (HOSPITAL_COMMUNITY): Payer: Self-pay | Admitting: Orthopedic Surgery

## 2020-12-04 DIAGNOSIS — M1712 Unilateral primary osteoarthritis, left knee: Secondary | ICD-10-CM | POA: Diagnosis not present

## 2020-12-04 DIAGNOSIS — Z96652 Presence of left artificial knee joint: Secondary | ICD-10-CM | POA: Diagnosis not present

## 2020-12-04 LAB — BASIC METABOLIC PANEL
Anion gap: 6 (ref 5–15)
BUN: 16 mg/dL (ref 8–23)
CO2: 26 mmol/L (ref 22–32)
Calcium: 8.5 mg/dL — ABNORMAL LOW (ref 8.9–10.3)
Chloride: 106 mmol/L (ref 98–111)
Creatinine, Ser: 0.64 mg/dL (ref 0.44–1.00)
GFR, Estimated: >60 mL/min (ref >60)
Glucose, Bld: 153 mg/dL — ABNORMAL HIGH (ref 70–99)
Potassium: 4.1 mmol/L (ref 3.5–5.1)
Sodium: 138 mmol/L (ref 135–145)

## 2020-12-04 LAB — CBC
HCT: 29.7 % — ABNORMAL LOW (ref 36.0–46.0)
Hemoglobin: 9.4 g/dL — ABNORMAL LOW (ref 12.0–15.0)
MCH: 27.6 pg (ref 26.0–34.0)
MCHC: 31.6 g/dL (ref 30.0–36.0)
MCV: 87.1 fL (ref 80.0–100.0)
Platelets: 309 10*3/uL (ref 150–400)
RBC: 3.41 MIL/uL — ABNORMAL LOW (ref 3.87–5.11)
RDW: 14.6 % (ref 11.5–15.5)
WBC: 20.7 10*3/uL — ABNORMAL HIGH (ref 4.0–10.5)
nRBC: 0 % (ref 0.0–0.2)

## 2020-12-04 MED ORDER — TRANEXAMIC ACID 650 MG PO TABS
1300.0000 mg | ORAL_TABLET | Freq: Once | ORAL | Status: AC
  Administered 2020-12-04: 09:00:00 1300 mg via ORAL
  Filled 2020-12-04: qty 2

## 2020-12-04 MED ORDER — ASPIRIN 81 MG PO CHEW: 81.0000 mg | CHEWABLE_TABLET | Freq: Two times a day (BID) | ORAL | 0 refills | Status: AC

## 2020-12-04 MED ORDER — METHOCARBAMOL 500 MG PO TABS: 500.0000 mg | ORAL_TABLET | Freq: Four times a day (QID) | ORAL | 0 refills | Status: DC | PRN

## 2020-12-04 MED ORDER — CELECOXIB 200 MG PO CAPS: 200.0000 mg | ORAL_CAPSULE | Freq: Two times a day (BID) | ORAL | 0 refills | Status: DC

## 2020-12-04 MED ORDER — HYDROCODONE-ACETAMINOPHEN 5-325 MG PO TABS: 1.0000 | ORAL_TABLET | ORAL | 0 refills | Status: DC | PRN

## 2020-12-04 MED ORDER — FERROUS SULFATE 325 (65 FE) MG PO TABS: 325.0000 mg | ORAL_TABLET | Freq: Two times a day (BID) | ORAL | 0 refills | Status: DC

## 2020-12-04 MED FILL — METHOCARBAMOL 500 MG TABLET: 500 | 10 days supply | Qty: 40 | Fill #0

## 2020-12-04 MED FILL — CELECOXIB 200 MG CAP: 200 | 30 days supply | Qty: 60 | Fill #0

## 2020-12-04 MED FILL — HYDROCODON-APAP 5-325: 5-325 | 4 days supply | Qty: 42 | Fill #0

## 2020-12-04 NOTE — TOC Transition Note (Addendum)
Transition of Care [facility]) - CM/SW Discharge Note   Patient Details  Name: [name] MRN: [id number] Date of Birth: 03/04/1951  Transition of Care [facility]) CM/SW Contact:  Lennart Pall, LCSW Phone Number: 12/04/2020, 10:49 AM   Clinical Narrative:    Met briefly with pt and confirmed she does need rolling walker - ordered.  Plan for OPPT at [facility].  No TOC needs.   Final next level of care: OP Rehab Barriers to Discharge: No Barriers Identified   Patient Goals and CMS Choice        Discharge Placement                       Discharge Plan and Services                DME Arranged: rolling walker DME Agency: [place] Determinants of Health (SDOH) Interventions     Readmission Risk Interventions No flowsheet data found.

## 2020-12-04 NOTE — Progress Notes (Signed)
Pt and spouse verbalize understanding of all discharge instructions. All questions answered. Awaiting last visit with physical therapyy

## 2020-12-04 NOTE — Progress Notes (Signed)
Physical Therapy Treatment Patient Details Name: [name] MRN: [id number] DOB: 01/01/1951 Today's Date: 12/04/2020    History of Present Illness Patient is 69 y.o. 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    PT Comments    Pt progressing well. Will see again this pm, should be able to d/c later today    Follow Up Recommendations  Follow surgeon's recommendation for DC plan and follow-up therapies;Outpatient PT     Equipment Recommendations  None recommended by PT    Recommendations for Other Services       Precautions / Restrictions Precautions Precautions: Knee;Fall Restrictions Weight Bearing Restrictions: No Other Position/Activity Restrictions: WBAT    Mobility  Bed Mobility Overal bed mobility: Needs Assistance Bed Mobility: Supine to Sit;Sit to Supine     Supine to sit: Min assist;HOB elevated Sit to supine: Min assist   General bed mobility comments: assist with LLE  Transfers Overall transfer level: Needs assistance Equipment used: Rolling walker (2 wheeled) Transfers: Sit to/from Stand Sit to Stand: Min guard;Min assist;From elevated surface         General transfer comment: cues for hand placement and LLE position. sit to stand x2 from elevated bed and from toilet  Ambulation/Gait Ambulation/Gait assistance: Min guard Gait Distance (Feet): 60 Feet Assistive device: Rolling walker (2 wheeled) Gait Pattern/deviations: Step-to pattern;Decreased stance time - left     General Gait Details: cues for sequence and shorter step length to assist with pain control   Stairs             Wheelchair Mobility    Modified Rankin (Stroke Patients Only)       Balance   Sitting-balance support: Feet supported Sitting balance-Leahy Scale: Good     Standing balance support: During functional activity;Bilateral upper extremity  supported Standing balance-Leahy Scale: Fair                              Cognition Arousal/Alertness: Awake/alert Behavior During Therapy: WFL for tasks assessed/performed Overall Cognitive Status: Within Functional Limits for tasks assessed                                        Exercises Total Joint Exercises Ankle Circles/Pumps: AROM;Both;10 reps Quad Sets: AROM;10 reps;Both Heel Slides: AAROM;Left;10 reps Hip ABduction/ADduction: AROM;AAROM;Left;10 reps Straight Leg Raises: 10 reps;AAROM;Left    General Comments        Pertinent Vitals/Pain Pain Assessment: 0-10 Pain Score: 4  Pain Location: Lt knee Pain Descriptors / Indicators: Aching;Discomfort Pain Intervention(s): Limited activity within patient's tolerance;Monitored during session;Premedicated before session;Repositioned    Home Living                      Prior Function            PT Goals (current goals can now be found in the care plan section) Acute Rehab PT Goals Patient Stated Goal: regain independence to keep up with her grandkids PT Goal Formulation: With patient Time For Goal Achievement: 12/10/20 Potential to Achieve Goals: Good Progress towards PT goals: Progressing toward goals    Frequency    7X/week      PT Plan Current plan remains appropriate    Co-evaluation  AM-PAC PT "6 Clicks" Mobility   Outcome Measure  Help needed turning from your back to your side while in a flat bed without using bedrails?: A Little Help needed moving from lying on your back to sitting on the side of a flat bed without using bedrails?: A Little Help needed moving to and from a bed to a chair (including a wheelchair)?: A Little Help needed standing up from a chair using your arms (e.g., wheelchair or bedside chair)?: A Little Help needed to walk in hospital room?: A Little Help needed climbing 3-5 steps with a railing? : A Little 6 Click Score:  18    End of Session Equipment Utilized During Treatment: Gait belt Activity Tolerance: Patient tolerated treatment well Patient left: with call bell/phone within reach;with family/visitor present;in bed;with bed alarm set   PT Visit Diagnosis: Muscle weakness (generalized) (M62.81);Difficulty in walking, not elsewhere classified (R26.2)     Time: [id number] PT Time Calculation (min) (ACUTE ONLY): 22 min  Charges:  $Gait Training: 8-22 mins                     Baxter Flattery, PT  Acute Rehab Dept ([place]) [phone] Pager [phone]  12/04/2020    [facility] 12/04/2020, 12:31 PM

## 2020-12-04 NOTE — Progress Notes (Signed)
   Subjective: 1 Day Post-Op Procedure(s) (LRB): TOTAL KNEE ARTHROPLASTY (Left) Patient reports pain as mild.   Patient seen in rounds by Dr. Charlann Boxer. Patient is well, and has had no acute complaints or problems other than discomfort in the left knee. No acute events overnight. Patient was limited in PT yesterday due to residual spinal effects. Voiding without difficulty, positive flatus.  We will start therapy today.   Objective: Vital signs in last 24 hours: Temp:  [97.8 F (36.6 C)-98.4 F (36.9 C)] 97.9 F (36.6 C) (12/29 0627) Pulse Rate:  [52-85] 59 (12/29 0627) Resp:  [11-25] 16 (12/29 0627) BP: (127-176)/(63-76) 142/76 (12/29 0627) SpO2:  [92 %-100 %] 97 % (12/29 0627) Weight:  [86.2 kg] 86.2 kg (12/28 1014)  Intake/Output from previous day:  Intake/Output Summary (Last 24 hours) at 12/04/2020 0743 Last data filed at 12/04/2020 0730 Gross per 24 hour  Intake 4077.7 ml  Output 930 ml  Net 3147.7 ml     Intake/Output this shift: Total I/O In: 240 [P.O.:240] Out: -   Labs: Recent Labs    12/04/20 0257  HGB 9.4*   Recent Labs    12/04/20 0257  WBC 20.7*  RBC 3.41*  HCT 29.7*  PLT 309   Recent Labs    12/04/20 0257  NA 138  K 4.1  CL 106  CO2 26  BUN 16  CREATININE 0.64  GLUCOSE 153*  CALCIUM 8.5*   No results for input(s): LABPT, INR in the last 72 hours.  Exam: General - Patient is Alert and Oriented Extremity - Neurologically intact Sensation intact distally Intact pulses distally Dorsiflexion/Plantar flexion intact Dressing - dressing C/D/I Motor Function - intact, moving foot and toes well on exam.   Past Medical History:  Diagnosis Date  . Anemia yrs ago   hx of  . Anxiety   . Arthritis   . Asthma    seasonal  . Depression   . Elevated cholesterol   . GERD (gastroesophageal reflux disease)   . Hx of right bundle branch block last 5 yrs  . Hypertension   . Reflux     Assessment/Plan: 1 Day Post-Op Procedure(s)  (LRB): TOTAL KNEE ARTHROPLASTY (Left) Active Problems:   S/P total knee arthroplasty, left  Estimated body mass index is 31.62 kg/m as calculated from the following:   Height as of this encounter: 5\' 5"  (1.651 m).   Weight as of this encounter: 86.2 kg. Advance diet Up with therapy D/C IV fluids   Patient's anticipated LOS is less than 2 midnights, meeting these requirements: - Younger than 51 - Lives within 1 hour of care - Has a competent adult at home to recover with post-op recover - NO history of  - Chronic pain requiring opiods  - Diabetes  - Coronary Artery Disease  - Heart failure  - Heart attack  - Stroke  - DVT/VTE  - Cardiac arrhythmia  - Respiratory Failure/COPD  - Renal failure  - Anemia  - Advanced Liver disease    DVT Prophylaxis - Aspirin Weight bearing as tolerated.  Hemoglobin stable this morning. Hgb 9.4 this AM, down from 13.2 pre-operatively, secondary to ABLA.   Plan is to go Home after hospital stay. Plan for discharge home today following 1-2 sessions of therapy as long as she is meeting her goals. Follow up in the office in 2 weeks.   77, PA-C Orthopedic Surgery [phone] 12/04/2020, 7:43 AM

## 2020-12-04 NOTE — Progress Notes (Signed)
12/04/20 1500  PT Visit Information  Assistance Needed +1  Pt progressing well. Meeting PT goals. Ready to d/c from PT standpoint with husband/family assist prn.   History of Present Illness Patient is 69 y.o. 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  Subjective Data  Patient Stated Goal regain independence to keep up with her grandkids  Precautions  Precautions Knee;Fall  Restrictions  Other Position/Activity Restrictions WBAT  Pain Assessment  Pain Assessment 0-10  Pain Score 5  Pain Location Lt knee  Pain Descriptors / Indicators Aching;Discomfort  Pain Intervention(s) Limited activity within patient's tolerance;Monitored during session;Premedicated before session;Repositioned  Cognition  Arousal/Alertness Awake/alert  Behavior During Therapy WFL for tasks assessed/performed  Overall Cognitive Status Within Functional Limits for tasks assessed  Bed Mobility  Overal bed mobility Needs Assistance  Bed Mobility Supine to Sit  Supine to sit HOB elevated;Supervision  Sit to supine Min assist  General bed mobility comments for safety  Transfers  Overall transfer level Needs assistance  Equipment used Rolling walker (2 wheeled)  Transfers Sit to/from Stand  Sit to Stand Supervision;Min guard  General transfer comment cues for hand placement and LLE position  Ambulation/Gait  Ambulation/Gait assistance Min guard  Gait Distance (Feet) 40 Feet  Assistive device Rolling walker (2 wheeled)  Gait Pattern/deviations Step-to pattern;Decreased stance time - left  General Gait Details cues for sequence and shorter step length to assist with pain control  Stairs Yes  Stairs assistance Min guard;Min assist  Stair Management No rails;Step to pattern;Forwards  Number of Stairs 1  General stair comments cues for sequence, safety. min to min/guard to steady when moving RW  Balance   Sitting-balance support Feet supported  Sitting balance-Leahy Scale Good  Standing balance support During functional activity;Bilateral upper extremity supported  Standing balance-Leahy Scale Fair  PT - End of Session  Equipment Utilized During Treatment Gait belt  Activity Tolerance Patient tolerated treatment well  Patient left with call bell/phone within reach;with family/visitor present;in bed;with bed alarm set   PT - Assessment/Plan  PT Plan Current plan remains appropriate  PT Visit Diagnosis Muscle weakness (generalized) (M62.81);Difficulty in walking, not elsewhere classified (R26.2)  PT Frequency (ACUTE ONLY) 7X/week  Follow Up Recommendations Follow surgeon's recommendation for DC plan and follow-up therapies;Outpatient PT  PT equipment None recommended by PT  AM-PAC PT "6 Clicks" Mobility Outcome Measure (Version 2)  Help needed turning from your back to your side while in a flat bed without using bedrails? 3  Help needed moving from lying on your back to sitting on the side of a flat bed without using bedrails? 3  Help needed moving to and from a bed to a chair (including a wheelchair)? 3  Help needed standing up from a chair using your arms (e.g., wheelchair or bedside chair)? 3  Help needed to walk in hospital room? 3  Help needed climbing 3-5 steps with a railing?  3  6 Click Score 18  Consider Recommendation of Discharge To: Home with [facility]  Acute Rehab PT Goals  PT Goal Formulation With patient  Time For Goal Achievement 12/10/20  Potential to Achieve Goals Good  PT Time Calculation  PT Start Time (ACUTE ONLY) 1438  PT Stop Time (ACUTE ONLY) 1453  PT Time Calculation (min) (ACUTE ONLY) 15 min  PT General Charges  $$ ACUTE PT VISIT 1 Visit  PT Treatments  $Gait Training  8-22 mins

## 2020-12-05 LAB — TYPE AND SCREEN
ABO/RH(D): A POS
Antibody Screen: NEGATIVE

## 2020-12-09 DIAGNOSIS — M25662 Stiffness of left knee, not elsewhere classified: Secondary | ICD-10-CM | POA: Diagnosis not present

## 2020-12-09 DIAGNOSIS — M25562 Pain in left knee: Secondary | ICD-10-CM | POA: Diagnosis not present

## 2020-12-09 NOTE — Discharge Summary (Signed)
Physician Discharge Summary   Patient ID: [name] MRN: [id number] DOB/AGE: 1951-08-21 70 y.o.  Admit date: 12/03/2020 Discharge date: 12/04/2020  Primary Diagnosis: Left knee osteoarthritis.   Admission Diagnoses:  Past Medical History:  Diagnosis Date  . Anemia yrs ago   hx of  . Anxiety   . Arthritis   . Asthma    seasonal  . Depression   . Elevated cholesterol   . GERD (gastroesophageal reflux disease)   . Hx of right bundle branch block last 5 yrs  . Hypertension   . Reflux    Discharge Diagnoses:   Active Problems:   S/P total knee arthroplasty, left  Estimated body mass index is 31.62 kg/m as calculated from the following:   Height as of this encounter: 5\' 5"  (1.651 m).   Weight as of this encounter: 86.2 kg.  Procedure:  Procedure(s) (LRB): TOTAL KNEE ARTHROPLASTY (Left)   Consults: None  HPI:  [name] is a 70 y.o. female patient of   mine.  The patient had been seen, evaluated, and treated for months conservatively in the   office with medication, activity modification, and injections.  The patient had   radiographic changes of bone-on-bone arthritis with endplate sclerosis and osteophytes noted.  Based on the radiographic changes and failed conservative measures, the patient   decided to proceed with definitive treatment, total knee replacement.  Risks of infection, DVT, component failure, need for revision surgery, neurovascular injury were reviewed in the office setting.  The postop course was reviewed stressing the efforts to maximize post-operative satisfaction and function.  Consent was obtained for benefit of pain   relief.   Laboratory Data: Admission on 12/03/2020, Discharged on 12/04/2020  Component Date Value Ref Range Status  . SARS Coronavirus 2 by RT PCR 12/03/2020 NEGATIVE  NEGATIVE Final   Comment: (NOTE) SARS-CoV-2 target nucleic acids are NOT DETECTED.  The SARS-CoV-2 RNA is generally detectable in upper  respiratory specimens during the acute phase of infection. The lowest concentration of SARS-CoV-2 viral copies this assay can detect is 138 copies/mL. A negative result does not preclude SARS-Cov-2 infection and should not be used as the sole basis for treatment or other patient management decisions. A negative result may occur with  improper specimen collection/handling, submission of specimen other than nasopharyngeal swab, presence of viral mutation(s) within the areas targeted by this assay, and inadequate number of viral copies(<138 copies/mL). A negative result must be combined with clinical observations, patient history, and epidemiological information. The expected result is Negative.  Fact Sheet for Patients:  EntrepreneurPulse.com.au  Fact Sheet for Healthcare Providers:  IncredibleEmployment.be  This test is no                          t yet approved or cleared by the [place] FDA and  has been authorized for detection and/or diagnosis of SARS-CoV-2 by FDA under an Emergency Use Authorization (EUA). This EUA will remain  in effect (meaning this test can be used) for the duration of the COVID-19 declaration under Section 564(b)(1) of the Act, 21 U.S.C.section 360bbb-3(b)(1), unless the authorization is terminated  or revoked sooner.      . Influenza A by PCR 12/03/2020 NEGATIVE  NEGATIVE Final  . Influenza B by PCR 12/03/2020 NEGATIVE  NEGATIVE Final   Comment: (NOTE) The Xpert Xpress SARS-CoV-2/FLU/RSV plus assay is intended as an aid in the diagnosis of influenza from Nasopharyngeal swab specimens and should not  be used as a sole basis for treatment. Nasal washings and aspirates are unacceptable for Xpert Xpress SARS-CoV-2/FLU/RSV testing.  Fact Sheet for Patients: EntrepreneurPulse.com.au  Fact Sheet for Healthcare Providers: IncredibleEmployment.be  This test is not yet approved or  cleared by the [place] FDA and has been authorized for detection and/or diagnosis of SARS-CoV-2 by FDA under an Emergency Use Authorization (EUA). This EUA will remain in effect (meaning this test can be used) for the duration of the COVID-19 declaration under Section 564(b)(1) of the Act, 21 U.S.C. section 360bbb-3(b)(1), unless the authorization is terminated or revoked.  Performed at [facility], [place] [address], [place], [place] [address]   . WBC 12/04/2020 20.7* 4.0 - 10.5 K/uL Final  . RBC 12/04/2020 3.41* 3.87 - 5.11 MIL/uL Final  . Hemoglobin 12/04/2020 9.4* 12.0 - 15.0 g/dL Final  . HCT 12/04/2020 29.7* 36.0 - 46.0 % Final  . MCV 12/04/2020 87.1  80.0 - 100.0 fL Final  . MCH 12/04/2020 27.6  26.0 - 34.0 pg Final  . MCHC 12/04/2020 31.6  30.0 - 36.0 g/dL Final  . RDW 12/04/2020 14.6  11.5 - 15.5 % Final  . Platelets 12/04/2020 309  150 - 400 K/uL Final  . nRBC 12/04/2020 0.0  0.0 - 0.2 % Final   Performed at [facility], [place] [address] [place], [place] [address]  . Sodium 12/04/2020 138  135 - 145 mmol/L Final  . Potassium 12/04/2020 4.1  3.5 - 5.1 mmol/L Final  . Chloride 12/04/2020 106  98 - 111 mmol/L Final  . CO2 12/04/2020 26  22 - 32 mmol/L Final  . Glucose, Bld 12/04/2020 153* 70 - 99 mg/dL Final   Glucose reference range applies only to samples taken after fasting for at least 8 hours.  . BUN 12/04/2020 16  8 - 23 mg/dL Final  . Creatinine, Ser 12/04/2020 0.64  0.44 - 1.00 mg/dL Final  . Calcium 12/04/2020 8.5* 8.9 - 10.3 mg/dL Final  . GFR, Estimated 12/04/2020 >60  >60 mL/min Final   Comment: (NOTE) Calculated using the CKD-EPI Creatinine Equation (2021)   . Anion gap 12/04/2020 6  5 - 15 Final   Performed at [facility], [place] [address], [place], [place] [address]  Hospital Outpatient Visit on 11/25/2020  Component Date Value Ref Range Status  . ABO/RH(D) 11/25/2020 A POS   Final  . Antibody  Screen 11/25/2020 NEG   Final  . Sample Expiration 11/25/2020 12/08/2020,2359   Final  . Extend sample reason 11/25/2020    Final                   Value:NO TRANSFUSIONS OR PREGNANCY IN THE PAST 3 MONTHS Performed at [place] [address], [place], [place] [address]   . WBC 11/25/2020 9.6  4.0 - 10.5 K/uL Final  . RBC 11/25/2020 4.77  3.87 - 5.11 MIL/uL Final  . Hemoglobin 11/25/2020 13.2  12.0 - 15.0 g/dL Final  . HCT 11/25/2020 41.9  36.0 - 46.0 % Final  . MCV 11/25/2020 87.8  80.0 - 100.0 fL Final  . MCH 11/25/2020 27.7  26.0 - 34.0 pg Final  . MCHC 11/25/2020 31.5  30.0 - 36.0 g/dL Final  . RDW 11/25/2020 14.2  11.5 - 15.5 % Final  . Platelets 11/25/2020 361  150 - 400 K/uL Final  . nRBC 11/25/2020 0.0  0.0 - 0.2 % Final   Performed at [facility],  [address] [place], [place] [address]  . Sodium 11/25/2020 143  135 - 145 mmol/L Final  . Potassium 11/25/2020 3.5  3.5 - 5.1 mmol/L Final  . Chloride 11/25/2020 105  98 - 111 mmol/L Final  . CO2 11/25/2020 28  22 - 32 mmol/L Final  . Glucose, Bld 11/25/2020 111* 70 - 99 mg/dL Final   Glucose reference range applies only to samples taken after fasting for at least 8 hours.  . BUN 11/25/2020 14  8 - 23 mg/dL Final  . Creatinine, Ser 11/25/2020 0.81  0.44 - 1.00 mg/dL Final  . Calcium 28/31/5176 9.5  8.9 - 10.3 mg/dL Final  . GFR, Estimated 11/25/2020 >60  >60 mL/min Final   Comment: (NOTE) Calculated using the CKD-EPI Creatinine Equation (2021)   . Anion gap 11/25/2020 10  5 - 15 Final   Performed at [facility], [address], [place], [place] [address]  . MRSA, PCR 11/25/2020 NEGATIVE  NEGATIVE Final  . Staphylococcus aureus 11/25/2020 NEGATIVE  NEGATIVE Final   Comment: (NOTE) The Xpert SA Assay (FDA approved for NASAL specimens in patients 46 years of age and older), is one component of a comprehensive surveillance program. It is not intended to diagnose  infection nor to guide or monitor treatment. Performed at [facility], [address] [place], [place] [address]      X-Rays:No results found.  EKG:No orders found for this or any previous visit.   Hospital Course: [name] is a 70 y.o. who was admitted to [facility]. They were brought to the operating room on 12/03/2020 and underwent Procedure(s): TOTAL KNEE ARTHROPLASTY.  Patient tolerated the procedure well and was later transferred to the recovery room and then to the orthopaedic floor for postoperative care. They were given PO and IV analgesics for pain control following their surgery. They were given 24 hours of postoperative antibiotics of  Anti-infectives (From admission, onward)   Start     Dose/Rate Route Frequency Ordered Stop   12/03/20 1800  ceFAZolin (ANCEF) IVPB 2g/100 mL premix        2 g 200 mL/hr over 30 Minutes Intravenous Every 6 hours 12/03/20 1619 12/04/20 0016   12/03/20 1000  ceFAZolin (ANCEF) IVPB 2g/100 mL premix        2 g 200 mL/hr over 30 Minutes Intravenous On call to O.R. 12/03/20 6948 12/03/20 1259     and started on DVT prophylaxis in the form of Aspirin.   PT and OT were ordered for total joint protocol. Discharge planning consulted to help with postop disposition and equipment needs.  Patient had a good night on the evening of surgery. They started to get up OOB with therapy on POD #1. Pt was seen during rounds and was ready to go home pending progress with therapy. Hemovac drain was pulled without difficulty. She worked with therapy on POD #1 and was meeting her goals. Pt was discharged to home later that day in stable condition.  Diet: Regular diet Activity: WBAT Follow-up: in 2 weeks Disposition: Home Discharged Condition: good   Discharge Instructions    Call MD / Call 911   Complete by: As directed    If you experience chest pain or shortness of breath, CALL 911 and be transported to the hospital emergency  room.  If you develope a fever above 101 F, pus (white drainage) or increased drainage or redness at the wound, or calf pain, call your surgeon's office.  Change dressing   Complete by: As directed    Maintain surgical dressing until follow up in the clinic. If the edges start to pull up, may reinforce with tape. If the dressing is no longer working, may remove and cover with gauze and tape, but must keep the area dry and clean.  Call with any questions or concerns.   Constipation Prevention   Complete by: As directed    Drink plenty of fluids.  Prune juice may be helpful.  You may use a stool softener, such as Colace (over the counter) 100 mg twice a day.  Use MiraLax (over the counter) for constipation as needed.   Diet - low sodium heart healthy   Complete by: As directed    Discharge instructions   Complete by: As directed    Maintain surgical dressing until follow up in the clinic. If the edges start to pull up, may reinforce with tape. If the dressing is no longer working, may remove and cover with gauze and tape, but must keep the area dry and clean.  Follow up in 2 weeks at [facility]. Call with any questions or concerns.   Increase activity slowly as tolerated   Complete by: As directed    Weight bearing as tolerated with assist device (walker, cane, etc) as directed, use it as long as suggested by your surgeon or therapist, typically at least 4-6 weeks.   TED hose   Complete by: As directed    Use stockings (TED hose) for 2 weeks on both leg(s).  You may remove them at night for sleeping.     Allergies as of 12/04/2020      Reactions   Dilaudid [hydromorphone]    Codeine Palpitations      Medication List    TAKE these medications   albuterol 108 (90 Base) MCG/ACT inhaler Commonly known as: VENTOLIN HFA Inhale 2 puffs into the lungs every 6 (six) hours as needed for wheezing.   ALPRAZolam 0.5 MG tablet Commonly known as: XANAX Take 0.25 mg by mouth daily as needed for  anxiety (when traveling).   aspirin 81 MG chewable tablet Chew 1 tablet (81 mg total) by mouth 2 (two) times daily for 28 days.   celecoxib 200 MG capsule Commonly known as: CELEBREX Take 1 capsule (200 mg total) by mouth 2 (two) times daily.   cholecalciferol 1000 units tablet Commonly known as: VITAMIN D Take 1,000 Units by mouth daily.   diltiazem 180 MG 24 hr capsule Commonly known as: CARDIZEM CD Take 180 mg by mouth every morning.   DULoxetine 60 MG capsule Commonly known as: CYMBALTA Take 60 mg by mouth daily before breakfast.   ferrous sulfate 325 (65 FE) MG tablet Take 1 tablet (325 mg total) by mouth 2 (two) times daily with a meal for 14 days.   HYDROcodone-acetaminophen 5-325 MG tablet Commonly known as: NORCO/VICODIN Take 1-2 tablets by mouth every 4 (four) hours as needed for moderate pain (pain score 4-6).   lisinopril 10 MG tablet Commonly known as: ZESTRIL Take 10 mg by mouth daily before breakfast.   methocarbamol 500 MG tablet Commonly known as: ROBAXIN Take 1 tablet (500 mg total) by mouth every 6 (six) hours as needed for muscle spasms.   omeprazole 40 MG capsule Commonly known as: PRILOSEC Take 40 mg by mouth daily.   simvastatin 20 MG tablet Commonly known as: ZOCOR Take 20 mg by mouth every evening.  Discharge Care Instructions  (From admission, onward)         Start     Ordered   12/04/20 0000  Change dressing       Comments: Maintain surgical dressing until follow up in the clinic. If the edges start to pull up, may reinforce with tape. If the dressing is no longer working, may remove and cover with gauze and tape, but must keep the area dry and clean.  Call with any questions or concerns.   12/04/20 0748          Follow-up Information    Paralee Cancel, MD. Schedule an appointment as soon as possible for a visit in 2 weeks.   Specialty: Orthopedic Surgery Contact information: [address] [place]  [place] [address] [phone]               Signed: Griffith Citron, PA-C Orthopedic Surgery 12/09/2020, 8:49 AM

## 2020-12-13 DIAGNOSIS — M25562 Pain in left knee: Secondary | ICD-10-CM | POA: Diagnosis not present

## 2020-12-13 DIAGNOSIS — M25662 Stiffness of left knee, not elsewhere classified: Secondary | ICD-10-CM | POA: Diagnosis not present

## 2020-12-16 DIAGNOSIS — F324 Major depressive disorder, single episode, in partial remission: Secondary | ICD-10-CM | POA: Diagnosis not present

## 2020-12-16 DIAGNOSIS — K219 Gastro-esophageal reflux disease without esophagitis: Secondary | ICD-10-CM | POA: Diagnosis not present

## 2020-12-16 DIAGNOSIS — I10 Essential (primary) hypertension: Secondary | ICD-10-CM | POA: Diagnosis not present

## 2020-12-16 DIAGNOSIS — E7800 Pure hypercholesterolemia, unspecified: Secondary | ICD-10-CM | POA: Diagnosis not present

## 2020-12-16 DIAGNOSIS — M179 Osteoarthritis of knee, unspecified: Secondary | ICD-10-CM | POA: Diagnosis not present

## 2020-12-16 DIAGNOSIS — F330 Major depressive disorder, recurrent, mild: Secondary | ICD-10-CM | POA: Diagnosis not present

## 2020-12-17 DIAGNOSIS — M25662 Stiffness of left knee, not elsewhere classified: Secondary | ICD-10-CM | POA: Diagnosis not present

## 2020-12-17 DIAGNOSIS — M25562 Pain in left knee: Secondary | ICD-10-CM | POA: Diagnosis not present

## 2020-12-19 ENCOUNTER — Other Ambulatory Visit (HOSPITAL_COMMUNITY): Payer: Self-pay | Admitting: Orthopedic Surgery

## 2020-12-19 DIAGNOSIS — M25562 Pain in left knee: Secondary | ICD-10-CM | POA: Diagnosis not present

## 2020-12-19 DIAGNOSIS — M25662 Stiffness of left knee, not elsewhere classified: Secondary | ICD-10-CM | POA: Diagnosis not present

## 2020-12-19 MED FILL — AMOXICILLIN 500 MG CAPSULE: 500 | 10 days supply | Qty: 20 | Fill #0

## 2020-12-20 ENCOUNTER — Other Ambulatory Visit (HOSPITAL_COMMUNITY): Payer: Self-pay | Admitting: Orthopedic Surgery

## 2020-12-20 MED FILL — HYDROCODON-APAP 5-325: 5-325 | 7 days supply | Qty: 60 | Fill #0

## 2020-12-24 ENCOUNTER — Other Ambulatory Visit (HOSPITAL_COMMUNITY): Payer: Self-pay

## 2020-12-26 DIAGNOSIS — M25662 Stiffness of left knee, not elsewhere classified: Secondary | ICD-10-CM | POA: Diagnosis not present

## 2020-12-26 DIAGNOSIS — M25562 Pain in left knee: Secondary | ICD-10-CM | POA: Diagnosis not present

## 2020-12-27 MED FILL — AMOXICILLIN 500 MG CAPSULE: 500 | 5 days supply | Qty: 20 | Fill #0

## 2021-01-02 MED FILL — OMEPRAZOLE 40 MG CPDR: 40 | 90 days supply | Qty: 90 | Fill #1

## 2021-01-08 DIAGNOSIS — M25662 Stiffness of left knee, not elsewhere classified: Secondary | ICD-10-CM | POA: Diagnosis not present

## 2021-01-08 DIAGNOSIS — M25562 Pain in left knee: Secondary | ICD-10-CM | POA: Diagnosis not present

## 2021-01-13 DIAGNOSIS — M25662 Stiffness of left knee, not elsewhere classified: Secondary | ICD-10-CM | POA: Diagnosis not present

## 2021-01-13 DIAGNOSIS — M25562 Pain in left knee: Secondary | ICD-10-CM | POA: Diagnosis not present

## 2021-01-20 DIAGNOSIS — Z471 Aftercare following joint replacement surgery: Secondary | ICD-10-CM | POA: Diagnosis not present

## 2021-01-20 DIAGNOSIS — Z96652 Presence of left artificial knee joint: Secondary | ICD-10-CM | POA: Diagnosis not present

## 2021-01-22 DIAGNOSIS — M25562 Pain in left knee: Secondary | ICD-10-CM | POA: Diagnosis not present

## 2021-01-22 DIAGNOSIS — M25662 Stiffness of left knee, not elsewhere classified: Secondary | ICD-10-CM | POA: Diagnosis not present

## 2021-01-28 DIAGNOSIS — M179 Osteoarthritis of knee, unspecified: Secondary | ICD-10-CM | POA: Diagnosis not present

## 2021-01-28 DIAGNOSIS — K219 Gastro-esophageal reflux disease without esophagitis: Secondary | ICD-10-CM | POA: Diagnosis not present

## 2021-01-28 DIAGNOSIS — I10 Essential (primary) hypertension: Secondary | ICD-10-CM | POA: Diagnosis not present

## 2021-01-28 DIAGNOSIS — E7800 Pure hypercholesterolemia, unspecified: Secondary | ICD-10-CM | POA: Diagnosis not present

## 2021-01-28 DIAGNOSIS — F324 Major depressive disorder, single episode, in partial remission: Secondary | ICD-10-CM | POA: Diagnosis not present

## 2021-01-29 DIAGNOSIS — M25562 Pain in left knee: Secondary | ICD-10-CM | POA: Diagnosis not present

## 2021-01-29 DIAGNOSIS — M25662 Stiffness of left knee, not elsewhere classified: Secondary | ICD-10-CM | POA: Diagnosis not present

## 2021-02-04 MED FILL — DULOXETINE HCL 60 MG CPEP: 60 | 90 days supply | Qty: 90 | Fill #1

## 2021-02-04 MED FILL — DILTIAZEM HCL ER COATED BEA: 180 | 90 days supply | Qty: 90 | Fill #1

## 2021-02-04 MED FILL — SIMVASTATIN 20 MG TABLET: 20 | 90 days supply | Qty: 90 | Fill #1

## 2021-02-04 MED FILL — LISINOPRIL 10 MG TABS: 10 | 90 days supply | Qty: 90 | Fill #1

## 2021-02-26 DIAGNOSIS — Z471 Aftercare following joint replacement surgery: Secondary | ICD-10-CM | POA: Diagnosis not present

## 2021-02-26 DIAGNOSIS — Z96652 Presence of left artificial knee joint: Secondary | ICD-10-CM | POA: Diagnosis not present

## 2021-02-26 DIAGNOSIS — M7541 Impingement syndrome of right shoulder: Secondary | ICD-10-CM | POA: Diagnosis not present

## 2021-02-26 DIAGNOSIS — M25511 Pain in right shoulder: Secondary | ICD-10-CM | POA: Diagnosis not present

## 2021-03-25 DIAGNOSIS — Z01419 Encounter for gynecological examination (general) (routine) without abnormal findings: Secondary | ICD-10-CM | POA: Diagnosis not present

## 2021-03-25 DIAGNOSIS — N952 Postmenopausal atrophic vaginitis: Secondary | ICD-10-CM | POA: Diagnosis not present

## 2021-03-25 DIAGNOSIS — Z6833 Body mass index (BMI) 33.0-33.9, adult: Secondary | ICD-10-CM | POA: Diagnosis not present

## 2021-03-29 ENCOUNTER — Other Ambulatory Visit: Payer: Self-pay | Admitting: Internal Medicine

## 2021-03-29 ENCOUNTER — Other Ambulatory Visit (HOSPITAL_COMMUNITY): Payer: Self-pay

## 2021-03-29 MED FILL — Omeprazole Cap Delayed Release 40 MG: ORAL | 90 days supply | Qty: 90 | Fill #0 | Status: AC

## 2021-03-31 ENCOUNTER — Other Ambulatory Visit: Payer: Self-pay | Admitting: Internal Medicine

## 2021-03-31 ENCOUNTER — Other Ambulatory Visit (HOSPITAL_COMMUNITY): Payer: Self-pay

## 2021-03-31 MED ORDER — NITROFURANTOIN MONOHYD MACRO 100 MG PO CAPS
ORAL_CAPSULE | ORAL | 0 refills | Status: DC
  Filled 2021-03-31: qty 14, 7d supply, fill #0

## 2021-04-03 ENCOUNTER — Other Ambulatory Visit (HOSPITAL_COMMUNITY): Payer: Self-pay

## 2021-04-03 ENCOUNTER — Other Ambulatory Visit: Payer: Self-pay | Admitting: Internal Medicine

## 2021-04-05 ENCOUNTER — Other Ambulatory Visit: Payer: Self-pay | Admitting: Internal Medicine

## 2021-04-05 ENCOUNTER — Other Ambulatory Visit (HOSPITAL_COMMUNITY): Payer: Self-pay

## 2021-04-08 ENCOUNTER — Other Ambulatory Visit (HOSPITAL_COMMUNITY): Payer: Self-pay

## 2021-04-08 ENCOUNTER — Other Ambulatory Visit: Payer: Self-pay | Admitting: Internal Medicine

## 2021-04-14 ENCOUNTER — Other Ambulatory Visit: Payer: Self-pay

## 2021-04-14 ENCOUNTER — Other Ambulatory Visit (HOSPITAL_COMMUNITY): Payer: Self-pay

## 2021-04-15 ENCOUNTER — Other Ambulatory Visit (HOSPITAL_COMMUNITY): Payer: Self-pay

## 2021-04-15 MED ORDER — ALPRAZOLAM 0.5 MG PO TABS
ORAL_TABLET | ORAL | 0 refills | Status: AC
  Filled 2021-04-15 – 2021-05-07 (×2): qty 30, 30d supply, fill #0

## 2021-04-16 ENCOUNTER — Other Ambulatory Visit (HOSPITAL_COMMUNITY): Payer: Self-pay

## 2021-04-16 DIAGNOSIS — Z96652 Presence of left artificial knee joint: Secondary | ICD-10-CM | POA: Diagnosis not present

## 2021-04-16 DIAGNOSIS — M7501 Adhesive capsulitis of right shoulder: Secondary | ICD-10-CM | POA: Diagnosis not present

## 2021-04-23 ENCOUNTER — Other Ambulatory Visit (HOSPITAL_COMMUNITY): Payer: Self-pay

## 2021-05-01 ENCOUNTER — Other Ambulatory Visit (HOSPITAL_COMMUNITY): Payer: Self-pay

## 2021-05-01 MED FILL — Simvastatin Tab 20 MG: ORAL | 90 days supply | Qty: 90 | Fill #0 | Status: AC

## 2021-05-01 MED FILL — Diltiazem HCl Coated Beads Cap ER 24HR 180 MG: ORAL | 90 days supply | Qty: 90 | Fill #0 | Status: AC

## 2021-05-01 MED FILL — Lisinopril Tab 10 MG: ORAL | 90 days supply | Qty: 90 | Fill #0 | Status: AC

## 2021-05-01 MED FILL — Duloxetine HCl Enteric Coated Pellets Cap 60 MG (Base Eq): ORAL | 90 days supply | Qty: 90 | Fill #0 | Status: AC

## 2021-05-07 ENCOUNTER — Other Ambulatory Visit (HOSPITAL_COMMUNITY): Payer: Self-pay

## 2021-05-15 ENCOUNTER — Other Ambulatory Visit (HOSPITAL_COMMUNITY): Payer: Self-pay

## 2021-05-15 MED ORDER — AZITHROMYCIN 250 MG PO TABS
ORAL_TABLET | ORAL | 0 refills | Status: DC
  Filled 2021-05-15: qty 6, 5d supply, fill #0

## 2021-05-23 ENCOUNTER — Other Ambulatory Visit (HOSPITAL_COMMUNITY): Payer: Self-pay

## 2021-05-23 DIAGNOSIS — S161XXA Strain of muscle, fascia and tendon at neck level, initial encounter: Secondary | ICD-10-CM | POA: Diagnosis not present

## 2021-05-23 DIAGNOSIS — M542 Cervicalgia: Secondary | ICD-10-CM | POA: Diagnosis not present

## 2021-05-23 MED ORDER — METHYLPREDNISOLONE 4 MG PO TABS
ORAL_TABLET | ORAL | 0 refills | Status: DC
  Filled 2021-05-23: qty 21, 6d supply, fill #0

## 2021-06-02 DIAGNOSIS — E7800 Pure hypercholesterolemia, unspecified: Secondary | ICD-10-CM | POA: Diagnosis not present

## 2021-06-02 DIAGNOSIS — F324 Major depressive disorder, single episode, in partial remission: Secondary | ICD-10-CM | POA: Diagnosis not present

## 2021-06-02 DIAGNOSIS — K219 Gastro-esophageal reflux disease without esophagitis: Secondary | ICD-10-CM | POA: Diagnosis not present

## 2021-06-02 DIAGNOSIS — R7303 Prediabetes: Secondary | ICD-10-CM | POA: Diagnosis not present

## 2021-06-02 DIAGNOSIS — J309 Allergic rhinitis, unspecified: Secondary | ICD-10-CM | POA: Diagnosis not present

## 2021-06-02 DIAGNOSIS — I10 Essential (primary) hypertension: Secondary | ICD-10-CM | POA: Diagnosis not present

## 2021-06-02 DIAGNOSIS — Z23 Encounter for immunization: Secondary | ICD-10-CM | POA: Diagnosis not present

## 2021-06-02 DIAGNOSIS — M5090 Cervical disc disorder, unspecified, unspecified cervical region: Secondary | ICD-10-CM | POA: Diagnosis not present

## 2021-06-19 DIAGNOSIS — Z1231 Encounter for screening mammogram for malignant neoplasm of breast: Secondary | ICD-10-CM | POA: Diagnosis not present

## 2021-07-03 MED FILL — Omeprazole Cap Delayed Release 40 MG: ORAL | 90 days supply | Qty: 90 | Fill #1 | Status: AC

## 2021-07-04 ENCOUNTER — Other Ambulatory Visit (HOSPITAL_COMMUNITY): Payer: Self-pay

## 2021-07-28 ENCOUNTER — Other Ambulatory Visit (HOSPITAL_COMMUNITY): Payer: Self-pay

## 2021-07-28 DIAGNOSIS — I10 Essential (primary) hypertension: Secondary | ICD-10-CM | POA: Diagnosis not present

## 2021-07-28 DIAGNOSIS — M7989 Other specified soft tissue disorders: Secondary | ICD-10-CM | POA: Diagnosis not present

## 2021-07-28 DIAGNOSIS — W5501XA Bitten by cat, initial encounter: Secondary | ICD-10-CM | POA: Diagnosis not present

## 2021-07-28 MED ORDER — DOXYCYCLINE HYCLATE 100 MG PO TABS
100.0000 mg | ORAL_TABLET | Freq: Two times a day (BID) | ORAL | 0 refills | Status: DC
Start: 2021-07-28 — End: 2021-08-07
  Filled 2021-07-28: qty 14, 7d supply, fill #0

## 2021-08-04 ENCOUNTER — Other Ambulatory Visit (HOSPITAL_COMMUNITY): Payer: Self-pay

## 2021-08-04 MED FILL — Simvastatin Tab 20 MG: ORAL | 90 days supply | Qty: 90 | Fill #1 | Status: AC

## 2021-08-04 MED FILL — Duloxetine HCl Enteric Coated Pellets Cap 60 MG (Base Eq): ORAL | 90 days supply | Qty: 90 | Fill #1 | Status: AC

## 2021-08-04 MED FILL — Diltiazem HCl Coated Beads Cap ER 24HR 180 MG: ORAL | 90 days supply | Qty: 90 | Fill #1 | Status: AC

## 2021-08-04 MED FILL — Lisinopril Tab 10 MG: ORAL | 90 days supply | Qty: 90 | Fill #1 | Status: AC

## 2021-08-07 ENCOUNTER — Other Ambulatory Visit (HOSPITAL_COMMUNITY): Payer: Self-pay

## 2021-08-07 MED ORDER — DOXYCYCLINE HYCLATE 100 MG PO TABS
ORAL_TABLET | ORAL | 0 refills | Status: DC
  Filled 2021-08-07: qty 14, 7d supply, fill #0

## 2021-08-15 ENCOUNTER — Other Ambulatory Visit (HOSPITAL_COMMUNITY): Payer: Self-pay

## 2021-08-15 MED ORDER — FLUCONAZOLE 150 MG PO TABS: ORAL_TABLET | ORAL | 1 refills | Status: DC

## 2021-08-20 DIAGNOSIS — H2513 Age-related nuclear cataract, bilateral: Secondary | ICD-10-CM | POA: Diagnosis not present

## 2021-08-20 DIAGNOSIS — H04123 Dry eye syndrome of bilateral lacrimal glands: Secondary | ICD-10-CM | POA: Diagnosis not present

## 2021-08-20 DIAGNOSIS — H5211 Myopia, right eye: Secondary | ICD-10-CM | POA: Diagnosis not present

## 2021-08-20 DIAGNOSIS — H524 Presbyopia: Secondary | ICD-10-CM | POA: Diagnosis not present

## 2021-08-20 DIAGNOSIS — H52203 Unspecified astigmatism, bilateral: Secondary | ICD-10-CM | POA: Diagnosis not present

## 2021-09-01 ENCOUNTER — Other Ambulatory Visit (HOSPITAL_COMMUNITY): Payer: Self-pay

## 2021-09-01 MED ORDER — ALBUTEROL SULFATE HFA 108 (90 BASE) MCG/ACT IN AERS
INHALATION_SPRAY | RESPIRATORY_TRACT | 3 refills | Status: AC
  Filled 2021-09-01: qty 18, 16d supply, fill #0

## 2021-09-03 DIAGNOSIS — M25511 Pain in right shoulder: Secondary | ICD-10-CM | POA: Diagnosis not present

## 2021-09-03 DIAGNOSIS — M7501 Adhesive capsulitis of right shoulder: Secondary | ICD-10-CM | POA: Diagnosis not present

## 2021-09-18 ENCOUNTER — Other Ambulatory Visit (HOSPITAL_COMMUNITY): Payer: Self-pay

## 2021-09-18 DIAGNOSIS — S61459A Open bite of unspecified hand, initial encounter: Secondary | ICD-10-CM | POA: Diagnosis not present

## 2021-09-18 DIAGNOSIS — K648 Other hemorrhoids: Secondary | ICD-10-CM | POA: Diagnosis not present

## 2021-09-18 MED ORDER — HYDROCORTISONE 2.5 % EX OINT
TOPICAL_OINTMENT | CUTANEOUS | 0 refills | Status: DC
  Filled 2021-09-18: qty 28.35, 7d supply, fill #0

## 2021-09-18 MED ORDER — AMOXICILLIN-POT CLAVULANATE 875-125 MG PO TABS
ORAL_TABLET | ORAL | 0 refills | Status: DC
  Filled 2021-09-18: qty 10, 5d supply, fill #0

## 2021-10-03 ENCOUNTER — Other Ambulatory Visit: Payer: Self-pay

## 2021-10-03 ENCOUNTER — Other Ambulatory Visit (HOSPITAL_COMMUNITY): Payer: Self-pay

## 2021-10-03 MED ORDER — ALPRAZOLAM 0.5 MG PO TABS
0.5000 mg | ORAL_TABLET | Freq: Every day | ORAL | 0 refills | Status: DC | PRN
  Filled 2021-10-03: qty 30, 30d supply, fill #0

## 2021-10-06 ENCOUNTER — Other Ambulatory Visit (HOSPITAL_COMMUNITY): Payer: Self-pay

## 2021-10-06 MED ORDER — OMEPRAZOLE 40 MG PO CPDR
DELAYED_RELEASE_CAPSULE | ORAL | 0 refills | Status: DC
  Filled 2021-10-06: qty 90, 90d supply, fill #0

## 2021-10-06 MED ORDER — PAXLOVID (300/100) 20 X 150 MG & 10 X 100MG PO TBPK
ORAL_TABLET | ORAL | 0 refills | Status: DC
  Filled 2021-10-06: qty 30, 5d supply, fill #0

## 2021-10-15 DIAGNOSIS — S7001XA Contusion of right hip, initial encounter: Secondary | ICD-10-CM | POA: Diagnosis not present

## 2021-10-15 DIAGNOSIS — Z23 Encounter for immunization: Secondary | ICD-10-CM | POA: Diagnosis not present

## 2021-10-15 DIAGNOSIS — U071 COVID-19: Secondary | ICD-10-CM | POA: Diagnosis not present

## 2021-10-15 DIAGNOSIS — W19XXXA Unspecified fall, initial encounter: Secondary | ICD-10-CM | POA: Diagnosis not present

## 2021-10-24 ENCOUNTER — Other Ambulatory Visit: Payer: Self-pay

## 2021-10-24 ENCOUNTER — Other Ambulatory Visit (HOSPITAL_COMMUNITY): Payer: Self-pay

## 2021-10-24 MED ORDER — SIMVASTATIN 20 MG PO TABS
ORAL_TABLET | ORAL | 2 refills | Status: AC
  Filled 2021-10-24: qty 90, 90d supply, fill #0

## 2021-10-24 MED ORDER — LISINOPRIL 10 MG PO TABS
ORAL_TABLET | ORAL | 2 refills | Status: AC
  Filled 2021-10-24: qty 90, 90d supply, fill #0

## 2021-10-24 MED ORDER — DULOXETINE HCL 60 MG PO CPEP
ORAL_CAPSULE | ORAL | 2 refills | Status: AC
  Filled 2021-10-24: qty 90, 90d supply, fill #0

## 2021-10-28 ENCOUNTER — Other Ambulatory Visit: Payer: Self-pay

## 2021-10-28 ENCOUNTER — Other Ambulatory Visit (HOSPITAL_COMMUNITY): Payer: Self-pay

## 2021-10-29 ENCOUNTER — Other Ambulatory Visit (HOSPITAL_COMMUNITY): Payer: Self-pay

## 2021-11-03 ENCOUNTER — Other Ambulatory Visit (HOSPITAL_COMMUNITY): Payer: Self-pay

## 2021-11-03 MED ORDER — DILTIAZEM HCL ER COATED BEADS 180 MG PO CP24
ORAL_CAPSULE | ORAL | 4 refills | Status: DC
  Filled 2021-11-03: qty 90, 90d supply, fill #0

## 2021-11-06 ENCOUNTER — Other Ambulatory Visit (HOSPITAL_COMMUNITY): Payer: Self-pay

## 2021-11-21 DIAGNOSIS — Z1389 Encounter for screening for other disorder: Secondary | ICD-10-CM | POA: Diagnosis not present

## 2021-11-21 DIAGNOSIS — Z0000 Encounter for general adult medical examination without abnormal findings: Secondary | ICD-10-CM | POA: Diagnosis not present

## 2021-11-21 DIAGNOSIS — R7303 Prediabetes: Secondary | ICD-10-CM | POA: Diagnosis not present

## 2021-11-21 DIAGNOSIS — F330 Major depressive disorder, recurrent, mild: Secondary | ICD-10-CM | POA: Diagnosis not present

## 2021-11-21 DIAGNOSIS — E7800 Pure hypercholesterolemia, unspecified: Secondary | ICD-10-CM | POA: Diagnosis not present

## 2021-11-21 DIAGNOSIS — M797 Fibromyalgia: Secondary | ICD-10-CM | POA: Diagnosis not present

## 2021-11-21 DIAGNOSIS — Z1211 Encounter for screening for malignant neoplasm of colon: Secondary | ICD-10-CM | POA: Diagnosis not present

## 2021-11-21 DIAGNOSIS — M19049 Primary osteoarthritis, unspecified hand: Secondary | ICD-10-CM | POA: Diagnosis not present

## 2021-11-21 DIAGNOSIS — Z23 Encounter for immunization: Secondary | ICD-10-CM | POA: Diagnosis not present

## 2021-11-21 DIAGNOSIS — I10 Essential (primary) hypertension: Secondary | ICD-10-CM | POA: Diagnosis not present

## 2021-11-21 DIAGNOSIS — E559 Vitamin D deficiency, unspecified: Secondary | ICD-10-CM | POA: Diagnosis not present

## 2021-11-21 DIAGNOSIS — M5090 Cervical disc disorder, unspecified, unspecified cervical region: Secondary | ICD-10-CM | POA: Diagnosis not present

## 2021-12-03 DIAGNOSIS — M19072 Primary osteoarthritis, left ankle and foot: Secondary | ICD-10-CM | POA: Diagnosis not present

## 2021-12-03 DIAGNOSIS — S93402A Sprain of unspecified ligament of left ankle, initial encounter: Secondary | ICD-10-CM | POA: Diagnosis not present

## 2021-12-03 DIAGNOSIS — M79672 Pain in left foot: Secondary | ICD-10-CM | POA: Diagnosis not present

## 2021-12-11 DIAGNOSIS — M25572 Pain in left ankle and joints of left foot: Secondary | ICD-10-CM | POA: Diagnosis not present

## 2021-12-11 DIAGNOSIS — S93492A Sprain of other ligament of left ankle, initial encounter: Secondary | ICD-10-CM | POA: Diagnosis not present

## 2021-12-20 DIAGNOSIS — S91331A Puncture wound without foreign body, right foot, initial encounter: Secondary | ICD-10-CM | POA: Diagnosis not present

## 2021-12-20 DIAGNOSIS — W5501XA Bitten by cat, initial encounter: Secondary | ICD-10-CM | POA: Diagnosis not present

## 2021-12-22 ENCOUNTER — Other Ambulatory Visit (HOSPITAL_COMMUNITY): Payer: Self-pay

## 2021-12-29 DIAGNOSIS — I10 Essential (primary) hypertension: Secondary | ICD-10-CM | POA: Diagnosis not present

## 2021-12-29 DIAGNOSIS — K219 Gastro-esophageal reflux disease without esophagitis: Secondary | ICD-10-CM | POA: Diagnosis not present

## 2021-12-29 DIAGNOSIS — E7800 Pure hypercholesterolemia, unspecified: Secondary | ICD-10-CM | POA: Diagnosis not present

## 2022-01-26 ENCOUNTER — Other Ambulatory Visit (HOSPITAL_COMMUNITY): Payer: Self-pay

## 2022-02-27 DIAGNOSIS — M7542 Impingement syndrome of left shoulder: Secondary | ICD-10-CM | POA: Diagnosis not present

## 2022-02-27 DIAGNOSIS — M7501 Adhesive capsulitis of right shoulder: Secondary | ICD-10-CM | POA: Diagnosis not present

## 2022-02-27 DIAGNOSIS — M7541 Impingement syndrome of right shoulder: Secondary | ICD-10-CM | POA: Diagnosis not present

## 2022-02-27 DIAGNOSIS — M7502 Adhesive capsulitis of left shoulder: Secondary | ICD-10-CM | POA: Diagnosis not present

## 2022-03-27 DIAGNOSIS — U071 COVID-19: Secondary | ICD-10-CM | POA: Diagnosis not present

## 2022-03-27 DIAGNOSIS — R10814 Left lower quadrant abdominal tenderness: Secondary | ICD-10-CM | POA: Diagnosis not present

## 2022-04-02 DIAGNOSIS — D72828 Other elevated white blood cell count: Secondary | ICD-10-CM | POA: Diagnosis not present

## 2022-04-02 DIAGNOSIS — Z8719 Personal history of other diseases of the digestive system: Secondary | ICD-10-CM | POA: Diagnosis not present

## 2022-04-02 DIAGNOSIS — Z8616 Personal history of COVID-19: Secondary | ICD-10-CM | POA: Diagnosis not present

## 2022-04-06 DIAGNOSIS — M13862 Other specified arthritis, left knee: Secondary | ICD-10-CM | POA: Diagnosis not present

## 2022-04-06 DIAGNOSIS — M1389 Other specified arthritis, multiple sites: Secondary | ICD-10-CM | POA: Diagnosis not present

## 2022-04-06 DIAGNOSIS — Z01419 Encounter for gynecological examination (general) (routine) without abnormal findings: Secondary | ICD-10-CM | POA: Diagnosis not present

## 2022-04-06 DIAGNOSIS — Z6831 Body mass index (BMI) 31.0-31.9, adult: Secondary | ICD-10-CM | POA: Diagnosis not present

## 2022-04-06 DIAGNOSIS — N958 Other specified menopausal and perimenopausal disorders: Secondary | ICD-10-CM | POA: Diagnosis not present

## 2022-07-02 DIAGNOSIS — E7800 Pure hypercholesterolemia, unspecified: Secondary | ICD-10-CM | POA: Diagnosis not present

## 2022-07-02 DIAGNOSIS — I10 Essential (primary) hypertension: Secondary | ICD-10-CM | POA: Diagnosis not present

## 2022-07-10 DIAGNOSIS — Z1231 Encounter for screening mammogram for malignant neoplasm of breast: Secondary | ICD-10-CM | POA: Diagnosis not present

## 2022-07-28 DIAGNOSIS — D485 Neoplasm of uncertain behavior of skin: Secondary | ICD-10-CM | POA: Diagnosis not present

## 2022-07-28 DIAGNOSIS — D1801 Hemangioma of skin and subcutaneous tissue: Secondary | ICD-10-CM | POA: Diagnosis not present

## 2022-07-28 DIAGNOSIS — L570 Actinic keratosis: Secondary | ICD-10-CM | POA: Diagnosis not present

## 2022-07-28 DIAGNOSIS — L821 Other seborrheic keratosis: Secondary | ICD-10-CM | POA: Diagnosis not present

## 2022-07-28 DIAGNOSIS — D225 Melanocytic nevi of trunk: Secondary | ICD-10-CM | POA: Diagnosis not present

## 2022-08-20 DIAGNOSIS — Z1211 Encounter for screening for malignant neoplasm of colon: Secondary | ICD-10-CM | POA: Diagnosis not present

## 2022-08-20 DIAGNOSIS — D123 Benign neoplasm of transverse colon: Secondary | ICD-10-CM | POA: Diagnosis not present

## 2022-08-20 DIAGNOSIS — K5730 Diverticulosis of large intestine without perforation or abscess without bleeding: Secondary | ICD-10-CM | POA: Diagnosis not present

## 2022-08-20 DIAGNOSIS — K648 Other hemorrhoids: Secondary | ICD-10-CM | POA: Diagnosis not present

## 2022-08-24 DIAGNOSIS — D123 Benign neoplasm of transverse colon: Secondary | ICD-10-CM | POA: Diagnosis not present

## 2022-09-03 DIAGNOSIS — H5712 Ocular pain, left eye: Secondary | ICD-10-CM | POA: Diagnosis not present

## 2022-09-03 DIAGNOSIS — H524 Presbyopia: Secondary | ICD-10-CM | POA: Diagnosis not present

## 2022-09-03 DIAGNOSIS — H2513 Age-related nuclear cataract, bilateral: Secondary | ICD-10-CM | POA: Diagnosis not present

## 2022-09-03 DIAGNOSIS — H52203 Unspecified astigmatism, bilateral: Secondary | ICD-10-CM | POA: Diagnosis not present

## 2022-09-15 DIAGNOSIS — R69 Illness, unspecified: Secondary | ICD-10-CM | POA: Diagnosis not present

## 2022-09-15 DIAGNOSIS — R059 Cough, unspecified: Secondary | ICD-10-CM | POA: Diagnosis not present

## 2022-09-15 DIAGNOSIS — L089 Local infection of the skin and subcutaneous tissue, unspecified: Secondary | ICD-10-CM | POA: Diagnosis not present

## 2022-12-23 DIAGNOSIS — M25511 Pain in right shoulder: Secondary | ICD-10-CM | POA: Diagnosis not present

## 2022-12-23 DIAGNOSIS — M25512 Pain in left shoulder: Secondary | ICD-10-CM | POA: Diagnosis not present

## 2022-12-28 DIAGNOSIS — M797 Fibromyalgia: Secondary | ICD-10-CM | POA: Diagnosis not present

## 2022-12-28 DIAGNOSIS — I10 Essential (primary) hypertension: Secondary | ICD-10-CM | POA: Diagnosis not present

## 2022-12-28 DIAGNOSIS — M5090 Cervical disc disorder, unspecified, unspecified cervical region: Secondary | ICD-10-CM | POA: Diagnosis not present

## 2022-12-28 DIAGNOSIS — R69 Illness, unspecified: Secondary | ICD-10-CM | POA: Diagnosis not present

## 2022-12-28 DIAGNOSIS — E7800 Pure hypercholesterolemia, unspecified: Secondary | ICD-10-CM | POA: Diagnosis not present

## 2022-12-28 DIAGNOSIS — E559 Vitamin D deficiency, unspecified: Secondary | ICD-10-CM | POA: Diagnosis not present

## 2022-12-28 DIAGNOSIS — K219 Gastro-esophageal reflux disease without esophagitis: Secondary | ICD-10-CM | POA: Diagnosis not present

## 2022-12-28 DIAGNOSIS — M19049 Primary osteoarthritis, unspecified hand: Secondary | ICD-10-CM | POA: Diagnosis not present

## 2022-12-28 DIAGNOSIS — R7303 Prediabetes: Secondary | ICD-10-CM | POA: Diagnosis not present

## 2022-12-28 DIAGNOSIS — R002 Palpitations: Secondary | ICD-10-CM | POA: Diagnosis not present

## 2023-02-23 ENCOUNTER — Encounter: Payer: Self-pay | Admitting: Podiatry

## 2023-02-23 ENCOUNTER — Ambulatory Visit: Payer: Medicare HMO | Admitting: Podiatry

## 2023-02-23 DIAGNOSIS — M2041 Other hammer toe(s) (acquired), right foot: Secondary | ICD-10-CM

## 2023-02-23 DIAGNOSIS — M21621 Bunionette of right foot: Secondary | ICD-10-CM | POA: Diagnosis not present

## 2023-02-23 DIAGNOSIS — L84 Corns and callosities: Secondary | ICD-10-CM

## 2023-02-23 NOTE — Progress Notes (Signed)
  Subjective:  Patient ID: [name], female    DOB: [birth year],   MRN: [id number]  Chief Complaint  Patient presents with   Callouses    Rm 21 Right 4th toe and ball of foot callus x 2-6 months.     72 y.o. female presents for concern of lesions on her right fourth toe and the ball of her foot that have been present for about 2 months. Relates a pair of boots made the ball of her foot more painful recently and hoping to get it feeling better.  . Denies any other pedal complaints. Denies n/v/f/c.   Past Medical History:  Diagnosis Date   Anemia yrs ago   hx of   Anxiety    Arthritis    Asthma    seasonal   Depression    Elevated cholesterol    GERD (gastroesophageal reflux disease)    Hx of right bundle branch block last 5 yrs   Hypertension    Reflux     Objective:  Physical Exam: Vascular: DP/PT pulses 2/4 bilateral. CFT <3 seconds. Normal hair growth on digits. No edema.  Skin. No lacerations or abrasions bilateral feet. Hyperkeratotic lesion noted to lateral fourth digit on right and plantar fifth metatarsal head on left.  Musculoskeletal: MMT 5/5 bilateral lower extremities in DF, PF, Inversion and Eversion. Deceased ROM in DF of ankle joint.  Hammered didgits 2-5 on right foot and tailors bunion deformity noted on left. No pain to palpation around these areas except on calluses.  Neurological: Sensation intact to light touch.   Assessment:   1. Hammertoe of right foot   2. Corns and callosities   3. Tailor's bunion of right foot      Plan:  Patient was evaluated and treated and all questions answered. -Discussed tailors bunions, hammertoes, corns and calluses  and treatment options;conservative and surgical management; risks, benefits, alternatives discussed. All patient's questions answered. -Discussed padding and wide shoe gear.   -Recommend continue with good supportive shoes and inserts.  -Hyperkeratotic tissue debrided as courtesy today with chisel.   -Toe cap provided.  -Discussed surgical options.  -Patient to return to office as needed or sooner if condition worsens.   Lorenda Peck, DPM

## 2023-03-23 ENCOUNTER — Other Ambulatory Visit: Payer: Self-pay | Admitting: Internal Medicine

## 2023-03-23 ENCOUNTER — Ambulatory Visit
Admission: RE | Admit: 2023-03-23 | Discharge: 2023-03-23 | Disposition: A | Discharge status: Home / Self Care | Payer: Medicare HMO | Source: Ambulatory Visit | Attending: Internal Medicine | Admitting: Internal Medicine

## 2023-03-23 DIAGNOSIS — R109 Unspecified abdominal pain: Secondary | ICD-10-CM | POA: Diagnosis not present

## 2023-03-23 DIAGNOSIS — R0789 Other chest pain: Secondary | ICD-10-CM | POA: Diagnosis not present

## 2023-03-23 DIAGNOSIS — M47814 Spondylosis without myelopathy or radiculopathy, thoracic region: Secondary | ICD-10-CM | POA: Diagnosis not present

## 2023-03-23 DIAGNOSIS — R0781 Pleurodynia: Secondary | ICD-10-CM | POA: Diagnosis not present

## 2023-04-01 DIAGNOSIS — R7303 Prediabetes: Secondary | ICD-10-CM | POA: Diagnosis not present

## 2023-04-01 DIAGNOSIS — I10 Essential (primary) hypertension: Secondary | ICD-10-CM | POA: Diagnosis not present

## 2023-04-01 DIAGNOSIS — J309 Allergic rhinitis, unspecified: Secondary | ICD-10-CM | POA: Diagnosis not present

## 2023-04-01 DIAGNOSIS — M549 Dorsalgia, unspecified: Secondary | ICD-10-CM | POA: Diagnosis not present

## 2023-04-01 DIAGNOSIS — F419 Anxiety disorder, unspecified: Secondary | ICD-10-CM | POA: Diagnosis not present

## 2023-04-01 DIAGNOSIS — F330 Major depressive disorder, recurrent, mild: Secondary | ICD-10-CM | POA: Diagnosis not present

## 2023-04-01 DIAGNOSIS — E7800 Pure hypercholesterolemia, unspecified: Secondary | ICD-10-CM | POA: Diagnosis not present

## 2023-06-28 DIAGNOSIS — M7542 Impingement syndrome of left shoulder: Secondary | ICD-10-CM | POA: Diagnosis not present

## 2023-06-28 DIAGNOSIS — M7541 Impingement syndrome of right shoulder: Secondary | ICD-10-CM | POA: Diagnosis not present

## 2023-07-26 DIAGNOSIS — R350 Frequency of micturition: Secondary | ICD-10-CM | POA: Diagnosis not present

## 2023-07-26 DIAGNOSIS — Z01419 Encounter for gynecological examination (general) (routine) without abnormal findings: Secondary | ICD-10-CM | POA: Diagnosis not present

## 2023-07-26 DIAGNOSIS — Z6821 Body mass index (BMI) 21.0-21.9, adult: Secondary | ICD-10-CM | POA: Diagnosis not present

## 2023-07-26 DIAGNOSIS — N952 Postmenopausal atrophic vaginitis: Secondary | ICD-10-CM | POA: Diagnosis not present

## 2023-08-05 ENCOUNTER — Ambulatory Visit (INDEPENDENT_AMBULATORY_CARE_PROVIDER_SITE_OTHER): Payer: Medicare HMO

## 2023-08-05 ENCOUNTER — Encounter: Payer: Self-pay | Admitting: Podiatry

## 2023-08-05 ENCOUNTER — Ambulatory Visit: Payer: Medicare HMO | Admitting: Podiatry

## 2023-08-05 DIAGNOSIS — M21621 Bunionette of right foot: Secondary | ICD-10-CM | POA: Diagnosis not present

## 2023-08-05 DIAGNOSIS — M7751 Other enthesopathy of right foot: Secondary | ICD-10-CM | POA: Diagnosis not present

## 2023-08-05 MED ORDER — TRIAMCINOLONE ACETONIDE 10 MG/ML IJ SUSP
10.0000 mg | Freq: Once | INTRAMUSCULAR | Status: AC
Start: 2023-08-05 — End: 2023-08-05
  Administered 2023-08-05: 10 mg via INTRA_ARTICULAR

## 2023-08-05 NOTE — Progress Notes (Signed)
Subjective:   Patient ID: [name], female   DOB: 72 y.o.   MRN: [id number]   HPI Patient presents with significant discomfort fifth metatarsal head right fluid buildup that is worsened recently and also moderate structural deformity that she feels like has gotten worse this year.  Patient's due to go for an [place] cruise    ROS      Objective:  Physical Exam  Neurovascular status found to be intact inflammation fluid around the fifth MPJ right keratotic lesion formation painful when pressed and moderate structural deformity     Assessment:  Inflammatory capsulitis fifth MPJ right structural bunion deformity tailor's bunion deformity     Plan:  8 MP x-ray reviewed today I did a sterile prep I injected the fifth MPJ plantar capsule 3 mg dexamethasone Kenalog 5 mg Xylocaine I debrided the lesion advised on fifth met head resection or possible other surgery and patient will be seen back in depending on how long this better for we can decide on what might be best

## 2023-10-22 DIAGNOSIS — Z1231 Encounter for screening mammogram for malignant neoplasm of breast: Secondary | ICD-10-CM | POA: Diagnosis not present

## 2023-12-03 DIAGNOSIS — R69 Illness, unspecified: Secondary | ICD-10-CM | POA: Diagnosis not present

## 2023-12-04 DIAGNOSIS — Z20828 Contact with and (suspected) exposure to other viral communicable diseases: Secondary | ICD-10-CM | POA: Diagnosis not present

## 2023-12-04 DIAGNOSIS — R059 Cough, unspecified: Secondary | ICD-10-CM | POA: Diagnosis not present

## 2023-12-09 DIAGNOSIS — R051 Acute cough: Secondary | ICD-10-CM | POA: Diagnosis not present

## 2023-12-09 DIAGNOSIS — J208 Acute bronchitis due to other specified organisms: Secondary | ICD-10-CM | POA: Diagnosis not present

## 2023-12-31 DIAGNOSIS — M19049 Primary osteoarthritis, unspecified hand: Secondary | ICD-10-CM | POA: Diagnosis not present

## 2023-12-31 DIAGNOSIS — E559 Vitamin D deficiency, unspecified: Secondary | ICD-10-CM | POA: Diagnosis not present

## 2023-12-31 DIAGNOSIS — K219 Gastro-esophageal reflux disease without esophagitis: Secondary | ICD-10-CM | POA: Diagnosis not present

## 2023-12-31 DIAGNOSIS — I10 Essential (primary) hypertension: Secondary | ICD-10-CM | POA: Diagnosis not present

## 2023-12-31 DIAGNOSIS — R7303 Prediabetes: Secondary | ICD-10-CM | POA: Diagnosis not present

## 2023-12-31 DIAGNOSIS — M797 Fibromyalgia: Secondary | ICD-10-CM | POA: Diagnosis not present

## 2023-12-31 DIAGNOSIS — E7800 Pure hypercholesterolemia, unspecified: Secondary | ICD-10-CM | POA: Diagnosis not present

## 2023-12-31 DIAGNOSIS — Z0000 Encounter for general adult medical examination without abnormal findings: Secondary | ICD-10-CM | POA: Diagnosis not present

## 2023-12-31 DIAGNOSIS — R200 Anesthesia of skin: Secondary | ICD-10-CM | POA: Diagnosis not present

## 2024-04-18 ENCOUNTER — Telehealth: Payer: Self-pay | Admitting: Diagnostic Neuroimaging

## 2024-04-18 NOTE — Telephone Encounter (Signed)
 In response to a vm left by pt at 11:24 this morning, pt asked if there were a way for her to be seen today.  Phone rep left a brief vm stating there was currently nothing available before current appointment date and time

## 2024-04-24 ENCOUNTER — Encounter: Payer: Self-pay | Admitting: Diagnostic Neuroimaging

## 2024-04-24 ENCOUNTER — Ambulatory Visit: Payer: Medicare Other | Admitting: Diagnostic Neuroimaging

## 2024-04-24 VITALS — BP 150/84 | HR 72 | Ht 65.0 in | Wt 192.8 lb

## 2024-04-24 DIAGNOSIS — R200 Anesthesia of skin: Secondary | ICD-10-CM | POA: Diagnosis not present

## 2024-04-24 NOTE — Patient Instructions (Signed)
 Right > left hand numbness (since ~fall 2024; likely bilateral carpal tunnel syndrome) - check EMG/NCS - wrist splint at bedtime - then consider referral to hand surgery consult

## 2024-04-24 NOTE — Progress Notes (Signed)
 [facility]  PATIENT: [name] DOB: 17-May-1951  REFERRING CLINICIAN: Jearldine Mina, MD HISTORY FROM: patient  REASON FOR VISIT: new consult   HISTORICAL  CHIEF COMPLAINT:  Chief Complaint  Patient presents with   New Patient (Initial Visit)    Rm 7, Pt alone. Pt referred by PCP for numbness and tingling in both hands, mostly in right hand. Pt states it wakes her up from sleep. Pt states has trigger finger on right hand also.    HISTORY OF PRESENT ILLNESS:   73 year old female here for evaluation of numbness and tingling.  Symptoms started in fall 2024 with numbness and tingling in the right greater than left hand.  Symptoms wake her up from sleep.  Digits 1 and 2 mainly affected.  No problems with proximal arms, neck, face, lower extremities or feet.   REVIEW OF SYSTEMS: Full 14 system review of systems performed and negative with exception of: as per HPI.  ALLERGIES: Allergies  Allergen Reactions   Dilaudid  [Hydromorphone ]    Codeine Palpitations    HOME MEDICATIONS: Outpatient Medications Prior to Visit  Medication Sig Dispense Refill   omeprazole  (PRILOSEC) 40 MG capsule Take 40 mg by mouth daily.     albuterol  (VENTOLIN  HFA) 108 (90 Base) MCG/ACT inhaler Inhale 2 puffs into the lungs every 4 hours as needed 18 g 3   ALPRAZolam  (XANAX ) 0.5 MG tablet Take 1 tablet by mouth once a day if needed 30 days 30 tablet 0   cholecalciferol (VITAMIN D) 1000 UNITS tablet Take 1,000 Units by mouth daily.      diltiazem  (CARDIZEM  CD) 180 MG 24 hr capsule Take 180 mg by mouth every morning.     DULoxetine  (CYMBALTA ) 60 MG capsule TAKE 1 CAPSULE BY MOUTH ONCE DAILY 90 capsule 2   lisinopril  (ZESTRIL ) 10 MG tablet TAKE 1 TABLET BY MOUTH ONCE DAILY 90 tablet 2   simvastatin  (ZOCOR ) 20 MG tablet TAKE 1 TABLET BY MOUTH ONCE DAILY 90 tablet 2   albuterol  (PROVENTIL  HFA;VENTOLIN  HFA) 108 (90 BASE) MCG/ACT inhaler Inhale 2 puffs into the lungs every 6 (six) hours  as needed for wheezing. (Patient not taking: Reported on 04/24/2024)     ALPRAZolam  (XANAX ) 0.5 MG tablet Take 0.25 mg by mouth daily as needed for anxiety (when traveling). (Patient not taking: Reported on 04/24/2024)     ALPRAZolam  (XANAX ) 0.5 MG tablet Take 1 tablet (0.5 mg total) by mouth daily as needed. (Patient not taking: Reported on 04/24/2024) 30 tablet 0   amoxicillin -clavulanate (AUGMENTIN ) 875-125 MG tablet Take 1 tablet by mouth every 12 hours with food for 5 days (Patient not taking: Reported on 04/24/2024) 10 tablet 0   azithromycin  (ZITHROMAX  Z-PAK) 250 MG tablet Take 2 tablets by mouth on the first day, then 1 tablet daily for 4 days (Patient not taking: Reported on 04/24/2024) 6 tablet 0   diltiazem  (CARDIZEM  CD) 180 MG 24 hr capsule TAKE 1 CAPSULE BY MOUTH EVERY MORNING ON AN EMPTY STOMACH 90 capsule 3   diltiazem  (CARDIZEM  CD) 180 MG 24 hr capsule TAKE 1 CAPSULE BY MOUTH EVERY MORNING ON AN EMPTY STOMACH (Patient not taking: Reported on 04/24/2024) 90 capsule 4   doxycycline  (VIBRA -TABS) 100 MG tablet Take 1 tablet by mouth twice a day for 7 days (Patient not taking: Reported on 04/24/2024) 14 tablet 0   DULoxetine  (CYMBALTA ) 60 MG capsule Take 60 mg by mouth daily before breakfast. (Patient not taking: Reported on 04/24/2024)     ferrous  sulfate 325 (65 FE) MG tablet Take 1 tablet (325 mg total) by mouth 2 (two) times daily with a meal for 14 days. 28 tablet 0   fluconazole  (DIFLUCAN ) 150 MG tablet Take 1 tablet by mouth once a day (Patient not taking: Reported on 04/24/2024) 1 tablet 1   fluconazole  (DIFLUCAN ) 150 MG tablet Take 1 tablet by mouth for 1 dose (Patient not taking: Reported on 04/24/2024) 1 tablet 1   hydrocortisone  2.5 % ointment Apply topically once a day for 7 days (Patient not taking: Reported on 04/24/2024) 28.35 g 0   lisinopril  (PRINIVIL ,ZESTRIL ) 10 MG tablet Take 10 mg by mouth daily before breakfast. (Patient not taking: Reported on 04/24/2024)     methylPREDNISolone   (MEDROL ) 4 MG tablet Take 6 tablets by mouth on day 1 then decrease by 1 tablet every day until finished (6-5-4-3-2-1) (Patient not taking: Reported on 04/24/2024) 21 tablet 0   nirmatrelvir  & ritonavir  (PAXLOVID , 300/100,) 20 x 150 MG & 10 x 100MG  TBPK Take 3 tablets by mouth 2 times a day (Patient not taking: Reported on 04/24/2024) 30 tablet 0   nitrofurantoin , macrocrystal-monohydrate, (MACROBID ) 100 MG capsule Take 1 capsule by mouth every 12 hours (Patient not taking: Reported on 04/24/2024) 14 capsule 0   omeprazole  (PRILOSEC) 40 MG capsule TAKE 1 CAPSULE BY MOUTH ONCE DAILY 90 capsule 3   omeprazole  (PRILOSEC) 40 MG capsule TAKE 1 CAPSULE BY MOUTH ONCE DAILY (Patient not taking: Reported on 04/24/2024) 90 capsule 0   simvastatin  (ZOCOR ) 20 MG tablet Take 20 mg by mouth every evening. (Patient not taking: Reported on 04/24/2024)     No facility-administered medications prior to visit.    PAST MEDICAL HISTORY: Past Medical History:  Diagnosis Date   Anemia yrs ago   hx of   Anxiety    Arthritis    Asthma    seasonal   Depression    Elevated cholesterol    GERD (gastroesophageal reflux disease)    Hx of right bundle branch block last 5 yrs   Hypertension    Reflux     PAST SURGICAL HISTORY: Past Surgical History:  Procedure Laterality Date   ANKLE SURGERY Right 7- 10 yrs ago   APPENDECTOMY  3 yrs ago   CHOLECYSTECTOMY     KNEE SURGERY Bilateral  69yrs ago   Arthroscopic   TOTAL HIP ARTHROPLASTY Right 05/02/2013   Procedure: RIGHT TOTAL HIP ARTHROPLASTY ANTERIOR APPROACH;  Surgeon: Bevin Bucks, MD;  Location: [facility];  Service: Orthopedics;  Laterality: Right;   TOTAL KNEE ARTHROPLASTY Right 09/26/2019   Procedure: TOTAL KNEE ARTHROPLASTY;  Surgeon: Claiborne Crew, MD;  Location: [facility];  Service: Orthopedics;  Laterality: Right;  70 mins   TOTAL KNEE ARTHROPLASTY Left 12/03/2020   Procedure: TOTAL KNEE ARTHROPLASTY;  Surgeon: Claiborne Crew, MD;  Location: [facility];  Service:  Orthopedics;  Laterality: Left;     FAMILY HISTORY: Family History  Problem Relation Age of Onset   Diabetes Mother    Hypertension Mother    Breast cancer Mother 64   Hypertension Father    Diabetes Maternal Grandmother    Colon cancer Maternal Grandfather    Cancer Maternal Grandfather        rectal-colon   Heart disease Paternal Grandmother    Heart disease Paternal Grandfather     SOCIAL HISTORY: Social History   Socioeconomic History   Marital status: Married    Spouse name: Not on file   Number of children: Not on  file   Years of education: Not on file   Highest education level: Not on file  Occupational History   Not on file  Tobacco Use   Smoking status: Former    Current packs/day: 0.00    Average packs/day: 1 pack/day for 10.0 years (10.0 ttl pk-yrs)    Types: Cigarettes    Start date: 03/30/1969    Quit date: 03/31/1979    Years since quitting: 45.0   Smokeless tobacco: Never  Vaping Use   Vaping status: Never Used  Substance and Sexual Activity   Alcohol use: Yes    Comment: very rare   Drug use: No   Sexual activity: Yes    Birth control/protection: Post-menopausal  Other Topics Concern   Not on file  Social History Narrative   Not on file   Social Drivers of Health   Financial Resource Strain: Not on file  Food Insecurity: Not on file  Transportation Needs: Not on file  Physical Activity: Not on file  Stress: Not on file  Social Connections: Not on file  Intimate Partner Violence: Not on file     PHYSICAL EXAM  GENERAL EXAM/CONSTITUTIONAL: Vitals:  Vitals:   04/24/24 1129  BP: (!) 150/84  Pulse: 72  Weight: 192 lb 12.8 oz (87.5 kg)  Height: 5\' 5"  (1.651 m)   Body mass index is 32.08 kg/m. Wt Readings from Last 3 Encounters:  04/24/24 192 lb 12.8 oz (87.5 kg)  12/03/20 190 lb (86.2 kg)  09/26/19 191 lb 9 oz (86.9 kg)   Patient is in no distress; well developed, nourished and groomed; neck is  supple  CARDIOVASCULAR: Examination of carotid arteries is normal; no carotid bruits Regular rate and rhythm, no murmurs Examination of peripheral vascular system by observation and palpation is normal  EYES: Ophthalmoscopic exam of optic discs and posterior segments is normal; no papilledema or hemorrhages No results found.  MUSCULOSKELETAL: Gait, strength, tone, movements noted in Neurologic exam below  NEUROLOGIC: MENTAL STATUS:      No data to display         awake, alert, oriented to person, place and time recent and remote memory intact normal attention and concentration language fluent, comprehension intact, naming intact fund of knowledge appropriate  CRANIAL NERVE:  2nd - no papilledema on fundoscopic exam 2nd, 3rd, 4th, 6th - pupils equal and reactive to light, visual fields full to confrontation, extraocular muscles intact, no nystagmus 5th - facial sensation symmetric 7th - facial strength symmetric 8th - hearing intact 9th - palate elevates symmetrically, uvula midline 11th - shoulder shrug symmetric 12th - tongue protrusion midline  MOTOR:  normal bulk and tone, full strength in the BUE, BLE EXCEPT ATROPHY AND WEAKNESS OF BILATERAL APB  SENSORY:  normal and symmetric to light touch, pinprick, temperature, vibration; EXCEPT DECR IN DIGITS 2-4 ON RIGHT PHALENS NEGATIVE BORDERLINE TINELS ON RIGHT  COORDINATION:  finger-nose-finger, fine finger movements normal  REFLEXES:  deep tendon reflexes 1+ and symmetric  GAIT/STATION:  narrow based gait     DIAGNOSTIC DATA (LABS, IMAGING, TESTING) - I reviewed patient records, labs, notes, testing and imaging myself where available.  Lab Results  Component Value Date   WBC 20.7 (H) 12/04/2020   HGB 9.4 (L) 12/04/2020   HCT 29.7 (L) 12/04/2020   MCV 87.1 12/04/2020   PLT 309 12/04/2020      Component Value Date/Time   NA 138 12/04/2020 0257   K 4.1 12/04/2020 0257   CL 106 12/04/2020 0257  CO2 26 12/04/2020 0257   GLUCOSE 153 (H) 12/04/2020 0257   BUN 16 12/04/2020 0257   CREATININE 0.64 12/04/2020 0257   CALCIUM  8.5 (L) 12/04/2020 0257   GFRNONAA >60 12/04/2020 0257   GFRAA >60 09/27/2019 0242   No results found for: "CHOL", "HDL", "LDLCALC", "LDLDIRECT", "TRIG", "CHOLHDL" No results found for: "HGBA1C" No results found for: "VITAMINB12" No results found for: "TSH"     ASSESSMENT AND PLAN  73 y.o. year old female here with:   Dx:  1. Bilateral hand numbness     PLAN:  Right > left hand numbness (since ~fall 2024; likely bilateral carpal tunnel syndrome) - check EMG/NCS - wrist splint at bedtime - then consider referral to hand surgery consult  Orders Placed This Encounter  Procedures   NCV with EMG(electromyography)   Return for for NCV/EMG with Dr. Gracie Lav.    Omega Bible, MD 04/24/2024, 11:53 AM Certified in Neurology, Neurophysiology and Neuroimaging  [facility] [address], [address] [place], [place] [address] [phone]

## 2024-05-31 ENCOUNTER — Encounter: Payer: Self-pay | Admitting: Podiatry

## 2024-05-31 ENCOUNTER — Ambulatory Visit: Admitting: Podiatry

## 2024-05-31 DIAGNOSIS — M545 Low back pain, unspecified: Secondary | ICD-10-CM | POA: Diagnosis not present

## 2024-05-31 DIAGNOSIS — M1612 Unilateral primary osteoarthritis, left hip: Secondary | ICD-10-CM | POA: Diagnosis not present

## 2024-05-31 DIAGNOSIS — L84 Corns and callosities: Secondary | ICD-10-CM | POA: Diagnosis not present

## 2024-05-31 DIAGNOSIS — M21621 Bunionette of right foot: Secondary | ICD-10-CM

## 2024-05-31 DIAGNOSIS — M51361 Other intervertebral disc degeneration, lumbar region with lower extremity pain only: Secondary | ICD-10-CM | POA: Diagnosis not present

## 2024-05-31 DIAGNOSIS — M4316 Spondylolisthesis, lumbar region: Secondary | ICD-10-CM | POA: Diagnosis not present

## 2024-05-31 NOTE — Addendum Note (Signed)
 Addended by: Shirleymae Hauth R on: 05/31/2024 04:21 PM   Modules accepted: Level of Service

## 2024-05-31 NOTE — Progress Notes (Addendum)
  Subjective:  Patient ID: [name], female    DOB: 11/11/1951,   MRN: [id number]  Chief Complaint  Patient presents with   Callouses    Rm21 Callous right foot /not diabetic/pain with pressure/Dr. Ransom Last visit Dec. 2024    73 y.o. female presents for follow-up fo lesion on her right fifth metatarsal. Relates it has been bothering er again for the past few months. Hoping to have it trimmed today.  Denies any other pedal complaints. Denies n/v/f/c.   Past Medical History:  Diagnosis Date   Anemia yrs ago   hx of   Anxiety    Arthritis    Asthma    seasonal   Depression    Elevated cholesterol    GERD (gastroesophageal reflux disease)    Hx of right bundle branch block last 5 yrs   Hypertension    Reflux     Objective:  Physical Exam: Vascular: DP/PT pulses 2/4 bilateral. CFT <3 seconds. Normal hair growth on digits. No edema.  Skin. No lacerations or abrasions bilateral feet. Hyperkeratotic lesion noted tplantar fifth metatarsal head on right  Musculoskeletal: MMT 5/5 bilateral lower extremities in DF, PF, Inversion and Eversion. Deceased ROM in DF of ankle joint.  Hammered didgits 2-5 on right foot and tailors bunion deformity noted on left. No pain to palpation around these areas except on calluses.  Neurological: Sensation intact to light touch.   Assessment:   1. Tailor's bunion of right foot       Plan:  Patient was evaluated and treated and all questions answered. -Discussed tailors bunions, hammertoes, corns and calluses  and treatment options;conservative and surgical management; risks, benefits, alternatives discussed. All patient's questions answered. -Discussed padding and wide shoe gear.   -Recommend continue with good supportive shoes and inserts.  -Hyperkeratotic tissue debrided  with chisel without incident as courtesy.  -ABN signed.  -Discussed surgical options.  -Patient to return to office as needed or sooner if condition  worsens.   Asberry Failing, DPM

## 2024-06-07 ENCOUNTER — Telehealth: Payer: Self-pay | Admitting: Neurology

## 2024-06-07 ENCOUNTER — Ambulatory Visit: Admitting: Neurology

## 2024-06-07 VITALS — BP 124/79 | HR 76 | Ht 64.0 in | Wt 187.0 lb

## 2024-06-07 DIAGNOSIS — R200 Anesthesia of skin: Secondary | ICD-10-CM | POA: Diagnosis not present

## 2024-06-07 DIAGNOSIS — G5603 Carpal tunnel syndrome, bilateral upper limbs: Secondary | ICD-10-CM

## 2024-06-07 NOTE — Procedures (Signed)
 Full Name: Nahal Wanless Gender: Female MRN #: [id number] Date of Birth: Oct 30, 1951    Visit Date: 06/07/2024 12:29 Age: 73 Years Examining Physician: Onita Duos Referring Physician: Dr. Mila Height: 5 feet 4 inch History: 73 year old female complains gradual onset bilateral hands numbness, weakness,  Summary of the test: Nerve conduction study: Right sural, superficial peroneal sensory responses were normal.  Right peroneal to EDB, tibial motor responses were normal.  Bilateral ulnar sensory and motor responses were within normal limit.  Bilateral radial sensory responses were normal.  Bilateral median sensory and motor responses were absent.  Electromyography: Selected needle examinations were performed at bilateral upper extremity muscles and cervical paraspinal muscles.  The abnormality is limited to bilateral abductor pollicis brevis, evidence of active denervation, and large complex motor unit potential with decreased recruitment patterns.   Conclusion: This is an abnormal study.  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  There is evidence of active denervation, axonal loss.    ------------------------------- Duos Onita. M.D. Ph.D.   [facility] [address] [place], [place] [address] Tel: [phone] Fax: [phone]  Verbal informed consent was obtained from the patient, patient was informed of potential risk of procedure, including bruising, bleeding, hematoma formation, infection, muscle weakness, muscle pain, numbness, among others.        MNC    Nerve / Sites Muscle Latency Ref. Amplitude Ref. Rel Amp Segments Distance Velocity Ref. Area    ms ms mV mV %  cm m/s m/s mVms  R Median - APB     Wrist APB NR <=4.4 NR >=4.0 NR Wrist - APB 7   NR     Upper arm APB 21.1     Upper arm - Wrist   >=49   L Median - APB      Wrist APB NR <=4.4 NR >=4.0 NR Wrist - APB 7   NR     Upper arm APB 21.1     Upper arm - Wrist   >=49   R Ulnar - ADM     Wrist ADM 2.8 <=3.3 12.6 >=6.0 100 Wrist - ADM 7   37.6     B.Elbow ADM 5.2  11.3  90 B.Elbow - Wrist 14 57 >=49 35.2     A.Elbow ADM 7.9  10.1  88.9 A.Elbow - B.Elbow 14 53 >=49 35.4  L Ulnar - ADM     Wrist ADM 3.1 <=3.3 13.1 >=6.0 100 Wrist - ADM 7   35.2     B.Elbow ADM 5.3  10.0  76.3 B.Elbow - Wrist 13 58 >=49 28.9     A.Elbow ADM 7.9  11.2  112 A.Elbow - B.Elbow 13 50 >=49 34.3  R Peroneal - EDB     Ankle EDB 4.6 <=6.5 2.7 >=2.0 100 Ankle - EDB 9   11.3     Fib head EDB 11.9  3.0  110 Fib head - Ankle 31 43 >=44 11.6     Pop fossa EDB 14.6  3.1  106 Pop fossa - Fib head 11 41 >=44 12.3         Pop fossa - Ankle      R Tibial - AH     Ankle AH 4.3 <=5.8 15.8 >=4.0 100 Ankle - AH 9   32.0     Pop fossa AH 12.7  12.7  80.1 Pop fossa - Ankle  46 55 >=41 29.7                 SNC    Nerve / Sites Rec. Site Peak Lat Ref.  Amp Ref. Segments Distance    ms ms V V  cm  R Radial - Anatomical snuff box (Forearm)     Forearm Wrist 2.0 <=2.9 26 >=15 Forearm - Wrist 10  L Radial - Anatomical snuff box (Forearm)     Forearm Wrist 1.9 <=2.9 26 >=15 Forearm - Wrist 10  R Sural - Ankle (Calf)     Calf Ankle 4.2 <=4.4 9 >=6 Calf - Ankle 14  R Superficial peroneal - Ankle     Lat leg Ankle 3.0 <=4.4 10 >=6 Lat leg - Ankle 14  R Median - Orthodromic (Dig II, Mid palm)     Dig II Wrist NR <=3.4 NR >=10 Dig II - Wrist 13  L Median - Orthodromic (Dig II, Mid palm)     Dig II Wrist NR <=3.4 NR >=10 Dig II - Wrist 13  R Ulnar - Orthodromic, (Dig V, Mid palm)     Dig V Wrist 3.0 <=3.1 5 >=5 Dig V - Wrist 11  L Ulnar - Orthodromic, (Dig V, Mid palm)     Dig V Wrist 3.3 <=3.1 5 >=5 Dig V - Wrist 38                     F  Wave    Nerve F Lat Ref.   ms ms  R Ulnar - ADM 28.6 <=32.0  L Ulnar - ADM 27.2 <=32.0  R Tibial - AH 52.4 <=56.0           EMG Summary Table     Spontaneous MUAP Recruitment  Muscle IA Fib PSW Fasc Other Amp Dur. Poly Pattern  R. First dorsal interosseous Increased None None None _______ Increased Increased 1+ Reduced  R. Abductor pollicis brevis Normal None None None _______ Normal Normal Normal Normal  R. Pronator teres Normal None None None _______ Normal Normal Normal Normal  R. Biceps brachii Normal None None None _______ Normal Normal Normal Normal  R. Deltoid Normal None None None _______ Normal Normal Normal Normal  R. Triceps brachii Normal None None None _______ Normal Normal Normal Normal  L. Abductor pollicis brevis Increased None None None _______ Increased Increased 1+ Reduced  L. First dorsal interosseous Normal None None None _______ Normal Normal Normal Normal  L. Pronator teres Normal None None None _______ Normal Normal Normal Normal  L. Biceps brachii Normal None None None _______ Normal Normal Normal Normal  L. Deltoid Normal None None None _______ Normal Normal Normal Normal  L. Triceps brachii Normal None None None _______ Normal Normal Normal Normal  R. Cervical paraspinals Normal None None None _______ Normal Normal Normal Normal  L. Cervical paraspinals Normal None None None _______ Normal Normal Normal Normal

## 2024-06-07 NOTE — Progress Notes (Signed)
 Chief Complaint  Patient presents with   NCS    Rm 3 alone Pt is well       ASSESSMENT AND PLAN  [name] is a 73 y.o. female   Severe bilateral carpal tunnel syndrome followed by EMG nerve conduction study June 07, 2024  Evidence of axonal loss at bilateral abductor pollicis brevis, mild to moderate muscle weakness, atrophy,  Refer her to Dr. Claudene Riis for decompression surgery  DIAGNOSTIC DATA (LABS, IMAGING, TESTING) - I reviewed patient records, labs, notes, testing and imaging myself where available.   MEDICAL HISTORY:  [name] is 73 year old right-handed female, seen in request by Dr. Margaret for evaluation of bilateral hands numbness, weakness,  History is obtained from the patient and review of electronic medical records. I personally reviewed pertinent available imaging films in PACS.   PMHx of  HTN GERD Anxiety Hx of right hip replacement, bilateral knee replacement  Around 2024, she noticed numbness tingling of bilateral hands, right more than left, also noticed weakness, difficulty to pick up small object with her fingers,  She denies significant neck pain, denied gait abnormality, no lower extremity paresthesia EMG nerve conduction study June 07, 2024 demonstrates severe bilateral carpal tunnel syndromes with evidence of axonal loss   PHYSICAL EXAM:   Vitals:   06/07/24 1123  BP: 124/79  Pulse: 76  Weight: 187 lb (84.8 kg)  Height: 5' 4 (1.626 m)     Body mass index is 32.1 kg/m.  PHYSICAL EXAMNIATION:  Gen: NAD, conversant, well nourised, well groomed                     Cardiovascular: Regular rate rhythm, no peripheral edema, warm, nontender. Eyes: Conjunctivae clear without exudates or hemorrhage Neck: Supple, no carotid bruits. Pulmonary: Clear to auscultation bilaterally   NEUROLOGICAL EXAM:  MENTAL STATUS: Speech/cognition: Awake, alert, oriented to history taking and casual conversation CRANIAL NERVES: CN II:  Visual fields are full to confrontation. Pupils are round equal and briskly reactive to light. CN III, IV, VI: extraocular movement are normal. No ptosis. CN V: Facial sensation is intact to light touch CN VII: Face is symmetric with normal eye closure  CN VIII: Hearing is normal to causal conversation. CN IX, X: Phonation is normal. CN XI: Head turning and shoulder shrug are intact  MOTOR: Moderate atrophy of bilateral abductor pollicis brevis, with mild to moderate weakness, finger abduction, wrist flexion extension and proximal upper extremity muscle strengths were normal  REFLEXES: Reflexes are 2+ and symmetric at the biceps, triceps, knees, and ankles. Plantar responses are flexor.  SENSORY: Decreased to light touch pinprick at fingerpads  COORDINATION: There is no trunk or limb dysmetria noted.  GAIT/STANCE: Posture is normal. Gait is steady    REVIEW OF SYSTEMS:  Full 14 system review of systems performed and notable only for as above All other review of systems were negative.   ALLERGIES: Allergies  Allergen Reactions   Dilaudid  [Hydromorphone ]    Codeine Palpitations    HOME MEDICATIONS: Current Outpatient Medications  Medication Sig Dispense Refill   albuterol  (VENTOLIN  HFA) 108 (90 Base) MCG/ACT inhaler Inhale 2 puffs into the lungs every 4 hours as needed 18 g 3   ALPRAZolam  (XANAX ) 0.5 MG tablet Take 1 tablet by mouth once a day if needed 30 days 30 tablet 0   cholecalciferol (VITAMIN D) 1000 UNITS tablet Take 1,000 Units by mouth daily.      diltiazem  (CARDIZEM  CD) 180  MG 24 hr capsule Take 180 mg by mouth every morning.     DULoxetine  (CYMBALTA ) 60 MG capsule TAKE 1 CAPSULE BY MOUTH ONCE DAILY 90 capsule 2   lisinopril  (ZESTRIL ) 10 MG tablet TAKE 1 TABLET BY MOUTH ONCE DAILY 90 tablet 2   omeprazole  (PRILOSEC) 40 MG capsule Take 40 mg by mouth daily.     simvastatin  (ZOCOR ) 20 MG tablet TAKE 1 TABLET BY MOUTH ONCE DAILY 90 tablet 2   No current  facility-administered medications for this visit.    PAST MEDICAL HISTORY: Past Medical History:  Diagnosis Date   Anemia yrs ago   hx of   Anxiety    Arthritis    Asthma    seasonal   Depression    Elevated cholesterol    GERD (gastroesophageal reflux disease)    Hx of right bundle branch block last 5 yrs   Hypertension    Reflux     PAST SURGICAL HISTORY: Past Surgical History:  Procedure Laterality Date   ANKLE SURGERY Right 7- 10 yrs ago   APPENDECTOMY  3 yrs ago   CHOLECYSTECTOMY     KNEE SURGERY Bilateral  7yrs ago   Arthroscopic   TOTAL HIP ARTHROPLASTY Right 05/02/2013   Procedure: RIGHT TOTAL HIP ARTHROPLASTY ANTERIOR APPROACH;  Surgeon: Donnice JONETTA Car, MD;  Location: [facility];  Service: Orthopedics;  Laterality: Right;   TOTAL KNEE ARTHROPLASTY Right 09/26/2019   Procedure: TOTAL KNEE ARTHROPLASTY;  Surgeon: Car Donnice, MD;  Location: [facility];  Service: Orthopedics;  Laterality: Right;  70 mins   TOTAL KNEE ARTHROPLASTY Left 12/03/2020   Procedure: TOTAL KNEE ARTHROPLASTY;  Surgeon: Car Donnice, MD;  Location: [facility];  Service: Orthopedics;  Laterality: Left;     FAMILY HISTORY: Family History  Problem Relation Age of Onset   Diabetes Mother    Hypertension Mother    Breast cancer Mother 62   Hypertension Father    Diabetes Maternal Grandmother    Colon cancer Maternal Grandfather    Cancer Maternal Grandfather        rectal-colon   Heart disease Paternal Grandmother    Heart disease Paternal Grandfather     SOCIAL HISTORY: Social History   Socioeconomic History   Marital status: Married    Spouse name: Not on file   Number of children: Not on file   Years of education: Not on file   Highest education level: Not on file  Occupational History   Not on file  Tobacco Use   Smoking status: Former    Current packs/day: 0.00    Average packs/day: 1 pack/day for 10.0 years (10.0 ttl pk-yrs)    Types: Cigarettes    Start date: 03/30/1969     Quit date: 03/31/1979    Years since quitting: 45.2   Smokeless tobacco: Never  Vaping Use   Vaping status: Never Used  Substance and Sexual Activity   Alcohol use: Yes    Comment: very rare   Drug use: No   Sexual activity: Yes    Birth control/protection: Post-menopausal  Other Topics Concern   Not on file  Social History Narrative   Not on file   Social Drivers of Health   Financial Resource Strain: Not on file  Food Insecurity: Not on file  Transportation Needs: Not on file  Physical Activity: Not on file  Stress: Not on file  Social Connections: Not on file  Intimate Partner Violence: Not on file      [place]  Onita, M.D. Ph.D.  [facility] [address], [address] [place], [place] [address] Ph: [phone] Fax: [phone]  CC:  Ransom Other, MD [address] [place],  [place] [address]  Husain, Karrar, MD

## 2024-06-07 NOTE — Telephone Encounter (Signed)
 Referral sent to Dr. Penne Sharps @ [facility] [place], phone # [phone].

## 2024-06-13 ENCOUNTER — Ambulatory Visit: Payer: Self-pay | Admitting: Neurology

## 2024-06-13 NOTE — Progress Notes (Signed)
 Kindly inform the patient that EMG nerve conduction study confirms Dr. Margaret suspicion of severe carpal tunnel that is pinched nerve at the wrist on both sides.  If wearing the wrist splint has not helped him he can be referred to a hand surgeon for surgery if he is willing

## 2024-06-14 NOTE — Telephone Encounter (Signed)
 Called the patient and she had discussed the findings with Dr Onita who also has placed a referral with hand specialist. Pt has apt scheduled next week. She was appreciative for the follow up

## 2024-06-14 NOTE — Telephone Encounter (Signed)
-----   Message from Eather Popp sent at 06/13/2024  5:46 PM EDT ----- Burna inform the patient that EMG nerve conduction study confirms Dr. Margaret suspicion of severe carpal tunnel that is pinched nerve at the wrist on both sides.  If wearing the wrist splint has  not helped him he can be referred to a hand surgeon for surgery if he is willing ----- Message ----- From: Delfino Augustin BROCKS, RN Sent: 06/13/2024  11:54 AM EDT To: Eather GORMAN Popp, MD  Please review/result in Dr Chancy absence ----- Message ----- From: Onita Duos, MD Sent: 06/09/2024   6:14 PM EDT To: Eduard JONELLE Margaret, MD

## 2024-06-15 DIAGNOSIS — F330 Major depressive disorder, recurrent, mild: Secondary | ICD-10-CM | POA: Insufficient documentation

## 2024-06-15 DIAGNOSIS — M179 Osteoarthritis of knee, unspecified: Secondary | ICD-10-CM | POA: Insufficient documentation

## 2024-06-15 DIAGNOSIS — M797 Fibromyalgia: Secondary | ICD-10-CM | POA: Insufficient documentation

## 2024-06-15 DIAGNOSIS — J322 Chronic ethmoidal sinusitis: Secondary | ICD-10-CM | POA: Insufficient documentation

## 2024-06-15 DIAGNOSIS — K219 Gastro-esophageal reflux disease without esophagitis: Secondary | ICD-10-CM | POA: Insufficient documentation

## 2024-06-15 DIAGNOSIS — M19049 Primary osteoarthritis, unspecified hand: Secondary | ICD-10-CM | POA: Insufficient documentation

## 2024-06-15 DIAGNOSIS — J309 Allergic rhinitis, unspecified: Secondary | ICD-10-CM | POA: Insufficient documentation

## 2024-06-15 DIAGNOSIS — K5732 Diverticulitis of large intestine without perforation or abscess without bleeding: Secondary | ICD-10-CM | POA: Insufficient documentation

## 2024-06-15 DIAGNOSIS — M5090 Cervical disc disorder, unspecified, unspecified cervical region: Secondary | ICD-10-CM | POA: Insufficient documentation

## 2024-06-15 DIAGNOSIS — E559 Vitamin D deficiency, unspecified: Secondary | ICD-10-CM | POA: Insufficient documentation

## 2024-06-15 NOTE — H&P (View-Only) (Signed)
 Referring Physician:  Onita Duos, MD [address] [place],  [place] [address]  Primary Physician:  Ransom Other, MD  History of Present Illness: 06/21/2024 [name] is here today with a chief complaint of severe bilateral carpal tunnel syndrome.  She states that she has probably had symptoms for at least the past multiple years.  This has been interfering with her life now that she is having difficulty with fine motor functions, opening bottles, utilizing buttons, and with utilizing her hand for writing.  She is also been dropping things.  She wakes up nightly having to wring out her hands.  This has become quite discomforting.  She had a recent EMG earlier in the month with Dr. Onita which demonstrated severe bilateral carpal tunnel syndrome.  She was referred here for evaluation.  Conservative measures:  Physical therapy: No Occupational Therapy: No Hand Therapy: No Injections: No Gabapentin: No, Lyrica: No, Cymbalta : Yes Past Surgery: No  Review of Systems:  A 10 point review of systems is negative, except for the pertinent positives and negatives detailed in the HPI.  Past Medical History: Past Medical History:  Diagnosis Date   Anemia yrs ago   hx of   Anxiety    Arthritis    Asthma    seasonal   Depression    Elevated cholesterol    GERD (gastroesophageal reflux disease)    Hx of right bundle branch block last 5 yrs   Hypertension    Reflux     Past Surgical History: Past Surgical History:  Procedure Laterality Date   ANKLE SURGERY Right 7- 10 yrs ago   APPENDECTOMY  3 yrs ago   CHOLECYSTECTOMY     KNEE SURGERY Bilateral  21yrs ago   Arthroscopic   TOTAL HIP ARTHROPLASTY Right 05/02/2013   Procedure: RIGHT TOTAL HIP ARTHROPLASTY ANTERIOR APPROACH;  Surgeon: Donnice JONETTA Car, MD;  Location: [facility];  Service: Orthopedics;  Laterality: Right;   TOTAL KNEE ARTHROPLASTY Right 09/26/2019   Procedure: TOTAL KNEE ARTHROPLASTY;  Surgeon: Car Donnice, MD;   Location: [facility];  Service: Orthopedics;  Laterality: Right;  70 mins   TOTAL KNEE ARTHROPLASTY Left 12/03/2020   Procedure: TOTAL KNEE ARTHROPLASTY;  Surgeon: Car Donnice, MD;  Location: [facility];  Service: Orthopedics;  Laterality: Left;     Allergies: Allergies as of 06/21/2024 - Review Complete 06/21/2024  Allergen Reaction Noted   Dilaudid  [hydromorphone ]  11/25/2020   Codeine Palpitations 10/08/2011    Medications:  Current Outpatient Medications:    albuterol  (VENTOLIN  HFA) 108 (90 Base) MCG/ACT inhaler, Inhale 2 puffs into the lungs every 4 hours as needed, Disp: 18 g, Rfl: 3   ALPRAZolam  (XANAX ) 0.5 MG tablet, Take 1 tablet by mouth once a day if needed 30 days, Disp: 30 tablet, Rfl: 0   cholecalciferol (VITAMIN D) 1000 UNITS tablet, Take 1,000 Units by mouth daily. , Disp: , Rfl:    diltiazem  (CARDIZEM  CD) 180 MG 24 hr capsule, Take 180 mg by mouth every morning., Disp: , Rfl:    DULoxetine  (CYMBALTA ) 60 MG capsule, TAKE 1 CAPSULE BY MOUTH ONCE DAILY, Disp: 90 capsule, Rfl: 2   lisinopril  (ZESTRIL ) 10 MG tablet, TAKE 1 TABLET BY MOUTH ONCE DAILY, Disp: 90 tablet, Rfl: 2   omeprazole  (PRILOSEC) 40 MG capsule, Take 40 mg by mouth daily., Disp: , Rfl:    simvastatin  (ZOCOR ) 20 MG tablet, TAKE 1 TABLET BY MOUTH ONCE DAILY, Disp: 90 tablet, Rfl: 2  Social  History: Social History   Tobacco Use   Smoking status: Former    Current packs/day: 0.00    Average packs/day: 1 pack/day for 10.0 years (10.0 ttl pk-yrs)    Types: Cigarettes    Start date: 03/30/1969    Quit date: 03/31/1979    Years since quitting: 45.2   Smokeless tobacco: Never  Vaping Use   Vaping status: Never Used  Substance Use Topics   Alcohol use: Yes    Comment: very rare   Drug use: No    Family Medical History: Family History  Problem Relation Age of Onset   Diabetes Mother    Hypertension Mother    Breast cancer Mother 19   Hypertension Father    Diabetes Maternal Grandmother    Colon  cancer Maternal Grandfather    Cancer Maternal Grandfather        rectal-colon   Heart disease Paternal Grandmother    Heart disease Paternal Grandfather     Physical Examination: Vitals:   06/21/24 0918  BP: 126/78    NEUROLOGICAL:     Awake, alert, oriented to person, place, and time.  Speech is clear and fluent.   Motor Exam:  She has definite wasting in the thenar eminence bilaterally.  She has weakness with her LOF muscles bilaterally.  These are approximately 4 out of 5 throughout.  C6 reflexes present bilaterally  She has some allodynia in the median nerve distribution bilaterally, however decreased to point discrimination when compared to her ulnar distribution bilaterally.  Medical Decision Making  Imaging: None  Electrodiagnostics: Full Name:     Forestine Macho                   Gender:               Female MRN #:           [id number]                      Date of Birth:      February 10, 1951    Visit Date:                            06/07/2024 12:29 Age:                                     73 Years Examining Physician:        Onita Duos Referring Physician:          Dr. Mila Height:                                 5 feet 4 inch History: 73 year old female complains gradual onset bilateral hands numbness, weakness,   Summary of the test: Nerve conduction study: Right sural, superficial peroneal sensory responses were normal.   Right peroneal to EDB, tibial motor responses were normal.   Bilateral ulnar sensory and motor responses were within normal limit.  Bilateral radial sensory responses were normal.   Bilateral median sensory and motor responses were absent.   Electromyography: Selected needle examinations were performed at bilateral upper extremity muscles and cervical paraspinal muscles.   The abnormality is limited to bilateral abductor pollicis brevis, evidence of active denervation, and large complex motor unit potential with decreased recruitment  patterns.  Conclusion: This is an abnormal study.  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  There is evidence of active denervation, axonal loss.       ------------------------------- Modena Callander. M.D. Ph.D.    [facility] [address] [place], [place] [address] Tel: [phone] Fax: [phone]  I have personally reviewed the images and electrodiagnostics and agree with the above interpretation.  Assessment and Plan: Ms. Grussing is a pleasant 73 y.o. female with carpal tunnel syndrome on the right hand carpal tunnel syndrome on the left.  She states that her right hand has becoming progressively worse over the past multiple years.  Its become acutely worse over the past 1 year.  She has noticed progressive loss of function bilaterally.  Loss of sensation causing her to drop things.  She is also had worsening waking symptoms. The symptoms are causing a significant impact on the patient's life. I have utilized the care everywhere function in epic to review the outside records available from external health systems, and have personally reviewed relevant imaging and electrodiagnostic workup.   Right sided carpal tunnel syndrome -electrodiagnostically severe, evidence of motor wasting.  This is her dominant hand, we will plan for a carpal tunnel decompression on the right.  We did discuss the risks and benefits of surgery including but not limited to infection wound bleeding wound issues pillar pain incomplete resolution of her symptoms  Left-sided carpal tunnel syndrome -this is also electrodiagnostically severe.  Also has motor wasting.  Slightly less severe motor weakness on this side however still profound.  She like to have this side treated second.  We discussed nighttime bracing however she had difficulty with this on the right hand as it would overheat and  cause her to wake up as well.  Will plan on treating this hand second after the first 1 is completed. We did discuss the risks and benefits of surgery including but not limited to infection wound bleeding wound issues pillar pain incomplete resolution of her symptoms  Thank you for involving me in the care of this patient.    [place] MICAEL Sharps MD/MSCR Neurosurgery - Peripheral Nerve Surgery

## 2024-06-15 NOTE — Progress Notes (Signed)
 Referring Physician:  Onita Duos, MD [address] [place],  [place] [address]  Primary Physician:  Ransom Other, MD  History of Present Illness: 06/21/2024 [name] is here today with a chief complaint of severe bilateral carpal tunnel syndrome.  She states that she has probably had symptoms for at least the past multiple years.  This has been interfering with her life now that she is having difficulty with fine motor functions, opening bottles, utilizing buttons, and with utilizing her hand for writing.  She is also been dropping things.  She wakes up nightly having to wring out her hands.  This has become quite discomforting.  She had a recent EMG earlier in the month with Dr. Onita which demonstrated severe bilateral carpal tunnel syndrome.  She was referred here for evaluation.  Conservative measures:  Physical therapy: No Occupational Therapy: No Hand Therapy: No Injections: No Gabapentin: No, Lyrica: No, Cymbalta : Yes Past Surgery: No  Review of Systems:  A 10 point review of systems is negative, except for the pertinent positives and negatives detailed in the HPI.  Past Medical History: Past Medical History:  Diagnosis Date   Anemia yrs ago   hx of   Anxiety    Arthritis    Asthma    seasonal   Depression    Elevated cholesterol    GERD (gastroesophageal reflux disease)    Hx of right bundle branch block last 5 yrs   Hypertension    Reflux     Past Surgical History: Past Surgical History:  Procedure Laterality Date   ANKLE SURGERY Right 7- 10 yrs ago   APPENDECTOMY  3 yrs ago   CHOLECYSTECTOMY     KNEE SURGERY Bilateral  21yrs ago   Arthroscopic   TOTAL HIP ARTHROPLASTY Right 05/02/2013   Procedure: RIGHT TOTAL HIP ARTHROPLASTY ANTERIOR APPROACH;  Surgeon: Donnice JONETTA Car, MD;  Location: [facility];  Service: Orthopedics;  Laterality: Right;   TOTAL KNEE ARTHROPLASTY Right 09/26/2019   Procedure: TOTAL KNEE ARTHROPLASTY;  Surgeon: Car Donnice, MD;   Location: [facility];  Service: Orthopedics;  Laterality: Right;  70 mins   TOTAL KNEE ARTHROPLASTY Left 12/03/2020   Procedure: TOTAL KNEE ARTHROPLASTY;  Surgeon: Car Donnice, MD;  Location: [facility];  Service: Orthopedics;  Laterality: Left;     Allergies: Allergies as of 06/21/2024 - Review Complete 06/21/2024  Allergen Reaction Noted   Dilaudid  [hydromorphone ]  11/25/2020   Codeine Palpitations 10/08/2011    Medications:  Current Outpatient Medications:    albuterol  (VENTOLIN  HFA) 108 (90 Base) MCG/ACT inhaler, Inhale 2 puffs into the lungs every 4 hours as needed, Disp: 18 g, Rfl: 3   ALPRAZolam  (XANAX ) 0.5 MG tablet, Take 1 tablet by mouth once a day if needed 30 days, Disp: 30 tablet, Rfl: 0   cholecalciferol (VITAMIN D) 1000 UNITS tablet, Take 1,000 Units by mouth daily. , Disp: , Rfl:    diltiazem  (CARDIZEM  CD) 180 MG 24 hr capsule, Take 180 mg by mouth every morning., Disp: , Rfl:    DULoxetine  (CYMBALTA ) 60 MG capsule, TAKE 1 CAPSULE BY MOUTH ONCE DAILY, Disp: 90 capsule, Rfl: 2   lisinopril  (ZESTRIL ) 10 MG tablet, TAKE 1 TABLET BY MOUTH ONCE DAILY, Disp: 90 tablet, Rfl: 2   omeprazole  (PRILOSEC) 40 MG capsule, Take 40 mg by mouth daily., Disp: , Rfl:    simvastatin  (ZOCOR ) 20 MG tablet, TAKE 1 TABLET BY MOUTH ONCE DAILY, Disp: 90 tablet, Rfl: 2  Social  History: Social History   Tobacco Use   Smoking status: Former    Current packs/day: 0.00    Average packs/day: 1 pack/day for 10.0 years (10.0 ttl pk-yrs)    Types: Cigarettes    Start date: 03/30/1969    Quit date: 03/31/1979    Years since quitting: 45.2   Smokeless tobacco: Never  Vaping Use   Vaping status: Never Used  Substance Use Topics   Alcohol use: Yes    Comment: very rare   Drug use: No    Family Medical History: Family History  Problem Relation Age of Onset   Diabetes Mother    Hypertension Mother    Breast cancer Mother 19   Hypertension Father    Diabetes Maternal Grandmother    Colon  cancer Maternal Grandfather    Cancer Maternal Grandfather        rectal-colon   Heart disease Paternal Grandmother    Heart disease Paternal Grandfather     Physical Examination: Vitals:   06/21/24 0918  BP: 126/78    NEUROLOGICAL:     Awake, alert, oriented to person, place, and time.  Speech is clear and fluent.   Motor Exam:  She has definite wasting in the thenar eminence bilaterally.  She has weakness with her LOF muscles bilaterally.  These are approximately 4 out of 5 throughout.  C6 reflexes present bilaterally  She has some allodynia in the median nerve distribution bilaterally, however decreased to point discrimination when compared to her ulnar distribution bilaterally.  Medical Decision Making  Imaging: None  Electrodiagnostics: Full Name:     [name]                   Gender:               Female MRN #:           [id number]                      Date of Birth:      February 10, 1951    Visit Date:                            06/07/2024 12:29 Age:                                     73 Years Examining Physician:        Onita Duos Referring Physician:          Dr. Mila Height:                                 5 feet 4 inch History: 73 year old female complains gradual onset bilateral hands numbness, weakness,   Summary of the test: Nerve conduction study: Right sural, superficial peroneal sensory responses were normal.   Right peroneal to EDB, tibial motor responses were normal.   Bilateral ulnar sensory and motor responses were within normal limit.  Bilateral radial sensory responses were normal.   Bilateral median sensory and motor responses were absent.   Electromyography: Selected needle examinations were performed at bilateral upper extremity muscles and cervical paraspinal muscles.   The abnormality is limited to bilateral abductor pollicis brevis, evidence of active denervation, and large complex motor unit potential with decreased recruitment  patterns.  Conclusion: This is an abnormal study.  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  There is evidence of active denervation, axonal loss.       ------------------------------- Modena Callander. M.D. Ph.D.    [facility] [address] [place], [place] [address] Tel: [phone] Fax: [phone]  I have personally reviewed the images and electrodiagnostics and agree with the above interpretation.  Assessment and Plan: [name] is a pleasant 73 y.o. female with carpal tunnel syndrome on the right hand carpal tunnel syndrome on the left.  She states that her right hand has becoming progressively worse over the past multiple years.  Its become acutely worse over the past 1 year.  She has noticed progressive loss of function bilaterally.  Loss of sensation causing her to drop things.  She is also had worsening waking symptoms. The symptoms are causing a significant impact on the patient's life. I have utilized the care everywhere function in epic to review the outside records available from external health systems, and have personally reviewed relevant imaging and electrodiagnostic workup.   Right sided carpal tunnel syndrome -electrodiagnostically severe, evidence of motor wasting.  This is her dominant hand, we will plan for a carpal tunnel decompression on the right.  We did discuss the risks and benefits of surgery including but not limited to infection wound bleeding wound issues pillar pain incomplete resolution of her symptoms  Left-sided carpal tunnel syndrome -this is also electrodiagnostically severe.  Also has motor wasting.  Slightly less severe motor weakness on this side however still profound.  She like to have this side treated second.  We discussed nighttime bracing however she had difficulty with this on the right hand as it would overheat and  cause her to wake up as well.  Will plan on treating this hand second after the first 1 is completed. We did discuss the risks and benefits of surgery including but not limited to infection wound bleeding wound issues pillar pain incomplete resolution of her symptoms  Thank you for involving me in the care of this patient.    Penne MICAEL Sharps MD/MSCR Neurosurgery - Peripheral Nerve Surgery

## 2024-06-21 ENCOUNTER — Ambulatory Visit: Admitting: Neurosurgery

## 2024-06-21 ENCOUNTER — Encounter: Payer: Self-pay | Admitting: Neurosurgery

## 2024-06-21 ENCOUNTER — Other Ambulatory Visit: Payer: Self-pay

## 2024-06-21 ENCOUNTER — Encounter
Admission: RE | Admit: 2024-06-21 | Discharge: 2024-06-21 | Disposition: A | Discharge status: Home / Self Care | Source: Ambulatory Visit | Attending: Neurosurgery | Admitting: Neurosurgery

## 2024-06-21 ENCOUNTER — Encounter: Payer: Self-pay | Admitting: Urgent Care

## 2024-06-21 VITALS — BP 126/78 | Ht 64.5 in | Wt 188.4 lb

## 2024-06-21 DIAGNOSIS — Z01812 Encounter for preprocedural laboratory examination: Secondary | ICD-10-CM

## 2024-06-21 DIAGNOSIS — G5603 Carpal tunnel syndrome, bilateral upper limbs: Secondary | ICD-10-CM | POA: Diagnosis not present

## 2024-06-21 DIAGNOSIS — Z01818 Encounter for other preprocedural examination: Secondary | ICD-10-CM | POA: Diagnosis not present

## 2024-06-21 DIAGNOSIS — I10 Essential (primary) hypertension: Secondary | ICD-10-CM | POA: Diagnosis not present

## 2024-06-21 DIAGNOSIS — E7800 Pure hypercholesterolemia, unspecified: Secondary | ICD-10-CM | POA: Insufficient documentation

## 2024-06-21 DIAGNOSIS — G5602 Carpal tunnel syndrome, left upper limb: Secondary | ICD-10-CM | POA: Insufficient documentation

## 2024-06-21 DIAGNOSIS — Z01810 Encounter for preprocedural cardiovascular examination: Secondary | ICD-10-CM

## 2024-06-21 DIAGNOSIS — E669 Obesity, unspecified: Secondary | ICD-10-CM | POA: Diagnosis not present

## 2024-06-21 DIAGNOSIS — G5601 Carpal tunnel syndrome, right upper limb: Secondary | ICD-10-CM | POA: Insufficient documentation

## 2024-06-21 HISTORY — DX: Spondylosis without myelopathy or radiculopathy, thoracic region: M47.814

## 2024-06-21 HISTORY — DX: Unspecified right bundle-branch block: I45.10

## 2024-06-21 HISTORY — DX: Carpal tunnel syndrome, bilateral upper limbs: G56.03

## 2024-06-21 HISTORY — DX: Hyperlipidemia, unspecified: E78.5

## 2024-06-21 LAB — CBC
HCT: 45.3 % (ref 36.0–46.0)
Hemoglobin: 14.8 g/dL (ref 12.0–15.0)
MCH: 28.6 pg (ref 26.0–34.0)
MCHC: 32.7 g/dL (ref 30.0–36.0)
MCV: 87.6 fL (ref 80.0–100.0)
Platelets: 312 K/uL (ref 150–400)
RBC: 5.17 MIL/uL — ABNORMAL HIGH (ref 3.87–5.11)
RDW: 13.5 % (ref 11.5–15.5)
WBC: 10.5 K/uL (ref 4.0–10.5)
nRBC: 0 % (ref 0.0–0.2)

## 2024-06-21 LAB — BASIC METABOLIC PANEL WITH GFR
Anion gap: 8 (ref 5–15)
BUN: 18 mg/dL (ref 8–23)
CO2: 27 mmol/L (ref 22–32)
Calcium: 10 mg/dL (ref 8.9–10.3)
Chloride: 108 mmol/L (ref 98–111)
Creatinine, Ser: 0.64 mg/dL (ref 0.44–1.00)
GFR, Estimated: >60 mL/min (ref >60)
Glucose, Bld: 85 mg/dL (ref 70–99)
Potassium: 3.8 mmol/L (ref 3.5–5.1)
Sodium: 143 mmol/L (ref 135–145)

## 2024-06-21 NOTE — Patient Instructions (Signed)
 Please see below for information in regards to your upcoming surgery:   Planned surgery: 1) right carpal tunnel release with ultrasound guidance, possible convert to open; 2) left carpal tunnel release with ultrasound guidance, possible convert to open   Surgery date: 1) 06/29/24 and 2) 08/01/24 at [facility] (Medical Mall: [address] [place], [place] [address]) - you will find out your arrival time the business day before your surgery.   Pre-op appointment at [facility] Pre-admit Testing: you will receive a call with a date/time for this appointment. If you are scheduled for an in person appointment, Pre-admit Testing is located on the first floor of the [facility] Arts building, [address] [facility], [address]. During this appointment, they will advise you which medications you can take the morning of surgery, and which medications you will need to hold for surgery. Labs (such as blood work, EKG) may be done at your pre-op appointment. You are not required to fast for these labs. Should you need to change your pre-op appointment, please call Pre-admit testing at [phone].     How to contact us :  If you have any questions/concerns before or after surgery, you can reach us  at [phone], or you can send a mychart message. We can be reached by phone or mychart 8am-4pm, Monday-Friday.  *Please note: Calls after 4pm are forwarded to a third party answering service. Mychart messages are not routinely monitored during evenings, weekends, and holidays. Please call our office to contact the answering service for urgent concerns during non-business hours.    If you have FMLA/disability paperwork, please drop it off or fax it to [phone]   Appointments/FMLA & disability paperwork: Reche [employer] Registered Nurse/Surgery scheduler: Pandora Mccrackin, RN Certified Medical Assistants: Don, CMA, Elenor, CMA, & Damien, CMA Physician Assistants: Lyle Decamp,  PA-C, Edsel Goods, PA-C & Glade Boys, PA-C Surgeons: Penne Sharps, MD & Reeves Daisy, MD   [facility] PREADMIT TESTING VISIT and SURGERY INFORMATION SHEET   Now that surgery has been scheduled you can anticipate several phone calls from [facility] services. A pharmacy technician will call you to verify your current list of medications taken at home.               The Pre-Service Center will call to verify your insurance information and to give you billing estimates and information.             The Preadmit Testing Office will be calling to schedule a visit to obtain information for the anesthesia team and provide instructions on preparation for surgery.  What can you expect for the Preadmit Testing Visit: Appointments may be scheduled in-person or by telephone.  If a telephone visit is scheduled, you may be asked to come into the office to have lab tests or other studies performed.   This visit will not be completed any greater than 14 days prior to your surgery.  If your surgery has been scheduled for a future date, please do not be alarmed if we have not contacted you to schedule an appointment more than a month prior to the surgery date.    Please be prepared to provide the following information during this appointment:            -Personal medical history                                               -  Medication and allergy list            -Any history of problems with anesthesia              -Recent lab work or diagnostic studies            -Please notify us  of any needs we should be aware of to provide the best care possible           -You will be provided with instructions on how to prepare for your surgery.    On The Day of Surgery:  You must have a driver to take you home after surgery, you will be asked not to drive for 24 hours following surgery.  Taxi, Gisele and non-medical transport will not be acceptable means of transportation unless you have a  responsible individual who will be traveling with you.  Visitors in the surgical area:   2 people will be able to visit you in your room once your preparation for surgery has been completed. During surgery, your visitors will be asked to wait in the Surgery Waiting Area.  It is not a requirement for them to stay, if they prefer to leave and come back.  Your visitor(s) will be given an update once the surgery has been completed.  No visitors are allowed in the initial recovery room to respect patient privacy and safety.  Once you are more awake and transfer to the secondary recovery area, or are transferred to an inpatient room, visitors will again be able to see you.  To respect and protect your privacy: We will ask on the day of surgery who your driver will be and what the contact number for that individual will be. We will ask if it is okay to share information with this individual, or if there is an alternative individual that we, or the surgeon, should contact to provide updates and information. If family or friends come to the surgical information desk requesting information about you, who you have not listed with us , no information will be given.   It may be helpful to designate someone as the main contact who will be responsible for updating your other friends and family.    PREADMIT TESTING OFFICE: [phone] SAME DAY SURGERY: [phone] We look forward to caring for you before and throughout the process of your surgery.

## 2024-06-23 ENCOUNTER — Other Ambulatory Visit: Payer: Self-pay

## 2024-06-23 ENCOUNTER — Encounter
Admission: RE | Admit: 2024-06-23 | Discharge: 2024-06-23 | Disposition: A | Discharge status: Home / Self Care | Source: Ambulatory Visit | Attending: Neurosurgery | Admitting: Neurosurgery

## 2024-06-23 HISTORY — DX: Dyspnea, unspecified: R06.00

## 2024-06-23 NOTE — Patient Instructions (Addendum)
 Your procedure is scheduled on: 06/29/24 - Thursday Report to the Registration Desk on the 1st floor of the Medical Mall. To find out your arrival time, please call [phone] between 1PM - 3PM on: 06/28/24 - Wednesday If your arrival time is 6:00 am, do not arrive before that time as the Medical Mall entrance doors do not open until 6:00 am.  REMEMBER: Instructions that are not followed completely may result in serious medical risk, up to and including death; or upon the discretion of your surgeon and anesthesiologist your surgery may need to be rescheduled.  Do not eat food after midnight the night before surgery.  No gum chewing or hard candies.  You may however, drink CLEAR liquids up to 2 hours before you are scheduled to arrive for your surgery. Do not drink anything within 2 hours of your scheduled arrival time.  Clear liquids include: - water   - apple juice without pulp - gatorade (not RED colors) - black coffee or tea (Do NOT add milk or creamers to the coffee or tea) Do NOT drink anything that is not on this list.  One week prior to surgery: Stop Anti-inflammatories (NSAIDS) such as Advil, Aleve, Ibuprofen, Motrin, Naproxen, Naprosyn and Aspirin  based products such as Excedrin, Goody's Powder, BC Powder. You may take Tylenol  if needed for pain up until the day of surgery.  Stop ANY OVER THE COUNTER supplements until after surgery : VITAMIN D    ON THE DAY OF SURGERY ONLY TAKE THESE MEDICATIONS WITH SIPS OF WATER :  diltiazem  (CARDIZEM  CD)  DULoxetine  (CYMBALTA )  omeprazole  (PRILOSEC)  simvastatin  (ZOCOR )  ALPRAZolam  (XANAX )   Use albuterol  (VENTOLIN  HFA)  on the day of surgery and bring to the hospital.   No Alcohol for 24 hours before or after surgery.  No Smoking including e-cigarettes for 24 hours before surgery.  No chewable tobacco products for at least 6 hours before surgery.  No nicotine patches on the day of surgery.  Do not use any recreational drugs  for at least a week (preferably 2 weeks) before your surgery.  Please be advised that the combination of cocaine and anesthesia may have negative outcomes, up to and including death. If you test positive for cocaine, your surgery will be cancelled.  On the morning of surgery brush your teeth with toothpaste and water , you may rinse your mouth with mouthwash if you wish. Do not swallow any toothpaste or mouthwash.  Use CHG Soap or wipes as directed on instruction sheet.  Do not wear jewelry, make-up, hairpins, clips or nail polish.  For welded (permanent) jewelry: bracelets, anklets, waist bands, etc.  Please have this removed prior to surgery.  If it is not removed, there is a chance that hospital personnel will need to cut it off on the day of surgery.  Do not wear lotions, powders, or perfumes.   Do not shave body hair from the neck down 48 hours before surgery.  Contact lenses, hearing aids and dentures may not be worn into surgery.  Do not bring valuables to the hospital. [facility] is not responsible for any missing/lost belongings or valuables.   Notify your doctor if there is any change in your medical condition (cold, fever, infection).  Wear comfortable clothing (specific to your surgery type) to the hospital.  After surgery, you can help prevent lung complications by doing breathing exercises.  Take deep breaths and cough every 1-2 hours. Your doctor may order a device called an Incentive Spirometer to help  you take deep breaths.  When coughing or sneezing, hold a pillow firmly against your incision with both hands. This is called "splinting." Doing this helps protect your incision. It also decreases belly discomfort.  If you are being admitted to the hospital overnight, leave your suitcase in the car. After surgery it may be brought to your room.  In case of increased patient census, it may be necessary for you, the patient, to continue your postoperative care in the Same  Day Surgery department.  If you are being discharged the day of surgery, you will not be allowed to drive home. You will need a responsible individual to drive you home and stay with you for 24 hours after surgery.   If you are taking public transportation, you will need to have a responsible individual with you.  Please call the Pre-admissions Testing Dept. at [phone] if you have any questions about these instructions.  Surgery Visitation Policy:  Patients having surgery or a procedure may have two visitors.  Children under the age of 19 must have an adult with them who is not the patient.  Inpatient Visitation:    Visiting hours are 7 a.m. to 8 p.m. Up to four visitors are allowed at one time in a patient room. The visitors may rotate out with other people during the day.  One visitor age 28 or older may stay with the patient overnight and must be in the room by 8 p.m.   Merchandiser, retail to address health-related social needs:  [email or web address]     Preparing for Surgery with CHLORHEXIDINE  GLUCONATE (CHG) Soap  Chlorhexidine  Gluconate (CHG) Soap  o An antiseptic cleaner that kills germs and bonds with the skin to continue killing germs even after washing  o Used for showering the night before surgery and morning of surgery  Before surgery, you can play an important role by reducing the number of germs on your skin.  CHG (Chlorhexidine  gluconate) soap is an antiseptic cleanser which kills germs and bonds with the skin to continue killing germs even after washing.  Please do not use if you have an allergy to CHG or antibacterial soaps. If your skin becomes reddened/irritated stop using the CHG.  1. Shower the NIGHT BEFORE SURGERY and the MORNING OF SURGERY with CHG soap.  2. If you choose to wash your hair, wash your hair first as usual with your normal shampoo.  3. After shampooing, rinse your hair and body thoroughly to remove the  shampoo.  4. Use CHG as you would any other liquid soap. You can apply CHG directly to the skin and wash gently with a scrungie or a clean washcloth.  5. Apply the CHG soap to your body only from the neck down. Do not use on open wounds or open sores. Avoid contact with your eyes, ears, mouth, and genitals (private parts). Wash face and genitals (private parts) with your normal soap.  6. Wash thoroughly, paying special attention to the area where your surgery will be performed.  7. Thoroughly rinse your body with warm water .  8. Do not shower/wash with your normal soap after using and rinsing off the CHG soap.  9. Pat yourself dry with a clean towel.  10. Wear clean pajamas to bed the night before surgery.  12. Place clean sheets on your bed the night of your first shower and do not sleep with pets.  13. Shower again with the CHG soap on the day of surgery prior to arriving  at the hospital.  14. Do not apply any deodorants/lotions/powders.  15. Please wear clean clothes to the hospital.

## 2024-06-26 DIAGNOSIS — D2261 Melanocytic nevi of right upper limb, including shoulder: Secondary | ICD-10-CM | POA: Diagnosis not present

## 2024-06-26 DIAGNOSIS — D2262 Melanocytic nevi of left upper limb, including shoulder: Secondary | ICD-10-CM | POA: Diagnosis not present

## 2024-06-26 DIAGNOSIS — D225 Melanocytic nevi of trunk: Secondary | ICD-10-CM | POA: Diagnosis not present

## 2024-06-26 DIAGNOSIS — L821 Other seborrheic keratosis: Secondary | ICD-10-CM | POA: Diagnosis not present

## 2024-06-26 DIAGNOSIS — D692 Other nonthrombocytopenic purpura: Secondary | ICD-10-CM | POA: Diagnosis not present

## 2024-06-26 DIAGNOSIS — D485 Neoplasm of uncertain behavior of skin: Secondary | ICD-10-CM | POA: Diagnosis not present

## 2024-06-29 ENCOUNTER — Ambulatory Visit: Payer: Self-pay | Admitting: Urgent Care

## 2024-06-29 ENCOUNTER — Encounter
Admission: RE | Disposition: A | Discharge status: Home / Self Care | Payer: Self-pay | Source: Home / Self Care | Attending: Neurosurgery

## 2024-06-29 ENCOUNTER — Ambulatory Visit
Admission: RE | Admit: 2024-06-29 | Discharge: 2024-06-29 | Disposition: A | Discharge status: Home / Self Care | Attending: Neurosurgery | Admitting: Neurosurgery

## 2024-06-29 ENCOUNTER — Encounter: Payer: Self-pay | Admitting: Neurosurgery

## 2024-06-29 ENCOUNTER — Other Ambulatory Visit: Payer: Self-pay

## 2024-06-29 DIAGNOSIS — K219 Gastro-esophageal reflux disease without esophagitis: Secondary | ICD-10-CM | POA: Insufficient documentation

## 2024-06-29 DIAGNOSIS — Z01810 Encounter for preprocedural cardiovascular examination: Secondary | ICD-10-CM

## 2024-06-29 DIAGNOSIS — I4510 Unspecified right bundle-branch block: Secondary | ICD-10-CM | POA: Diagnosis not present

## 2024-06-29 DIAGNOSIS — E7800 Pure hypercholesterolemia, unspecified: Secondary | ICD-10-CM

## 2024-06-29 DIAGNOSIS — Z87891 Personal history of nicotine dependence: Secondary | ICD-10-CM | POA: Diagnosis not present

## 2024-06-29 DIAGNOSIS — G5603 Carpal tunnel syndrome, bilateral upper limbs: Secondary | ICD-10-CM | POA: Insufficient documentation

## 2024-06-29 DIAGNOSIS — Z01818 Encounter for other preprocedural examination: Secondary | ICD-10-CM

## 2024-06-29 DIAGNOSIS — G5601 Carpal tunnel syndrome, right upper limb: Secondary | ICD-10-CM | POA: Diagnosis not present

## 2024-06-29 DIAGNOSIS — Z01812 Encounter for preprocedural laboratory examination: Secondary | ICD-10-CM

## 2024-06-29 DIAGNOSIS — I10 Essential (primary) hypertension: Secondary | ICD-10-CM | POA: Insufficient documentation

## 2024-06-29 DIAGNOSIS — J45909 Unspecified asthma, uncomplicated: Secondary | ICD-10-CM | POA: Insufficient documentation

## 2024-06-29 DIAGNOSIS — E669 Obesity, unspecified: Secondary | ICD-10-CM

## 2024-06-29 HISTORY — PX: CARPAL TUNNEL RELEASE: SHX101

## 2024-06-29 SURGERY — CARPAL TUNNEL RELEASE
Anesthesia: Monitor Anesthesia Care | Laterality: Right

## 2024-06-29 MED ORDER — PROPOFOL 500 MG/50ML IV EMUL
INTRAVENOUS | Status: DC | PRN
  Administered 2024-06-29: 100 ug/kg/min via INTRAVENOUS
  Administered 2024-06-29: 30 mg via INTRAVENOUS

## 2024-06-29 MED ORDER — PROPOFOL 10 MG/ML IV BOLUS
INTRAVENOUS | Status: AC
Start: 2024-06-29 — End: 2024-06-29
  Filled 2024-06-29: qty 20

## 2024-06-29 MED ORDER — BUPIVACAINE LIPOSOME 1.3 % IJ SUSP
INTRAMUSCULAR | Status: AC
  Filled 2024-06-29: qty 10

## 2024-06-29 MED ORDER — MIDAZOLAM HCL 2 MG/2ML IJ SOLN
INTRAMUSCULAR | Status: DC | PRN
  Administered 2024-06-29: 2 mg via INTRAVENOUS

## 2024-06-29 MED ORDER — ORAL CARE MOUTH RINSE: 15.0000 mL | Freq: Once | OROMUCOSAL | Status: AC

## 2024-06-29 MED ORDER — CEFAZOLIN SODIUM-DEXTROSE 2-4 GM/100ML-% IV SOLN
2.0000 g | Freq: Once | INTRAVENOUS | Status: AC
  Administered 2024-06-29: 2 g via INTRAVENOUS

## 2024-06-29 MED ORDER — OXYCODONE HCL 5 MG/5ML PO SOLN: 5.0000 mg | Freq: Once | ORAL | Status: DC | PRN

## 2024-06-29 MED ORDER — 0.9 % SODIUM CHLORIDE (POUR BTL) OPTIME
TOPICAL | Status: DC | PRN
Start: 2024-06-29 — End: 2024-06-29
  Administered 2024-06-29: 500 mL

## 2024-06-29 MED ORDER — CHLORHEXIDINE GLUCONATE 0.12 % MT SOLN
OROMUCOSAL | Status: AC
  Filled 2024-06-29: qty 15

## 2024-06-29 MED ORDER — DEXAMETHASONE SODIUM PHOSPHATE 10 MG/ML IJ SOLN
INTRAMUSCULAR | Status: AC
  Filled 2024-06-29: qty 1

## 2024-06-29 MED ORDER — ONDANSETRON HCL 4 MG/2ML IJ SOLN
INTRAMUSCULAR | Status: DC | PRN
Start: 2024-06-29 — End: 2024-06-29
  Administered 2024-06-29: 4 mg via INTRAVENOUS

## 2024-06-29 MED ORDER — OXYCODONE HCL 5 MG PO TABS: 5.0000 mg | ORAL_TABLET | Freq: Once | ORAL | Status: DC | PRN

## 2024-06-29 MED ORDER — KETOROLAC TROMETHAMINE 30 MG/ML IJ SOLN
INTRAMUSCULAR | Status: DC | PRN
  Administered 2024-06-29: 15 mg via INTRAVENOUS

## 2024-06-29 MED ORDER — SODIUM CHLORIDE (PF) 0.9 % IJ SOLN
INTRAMUSCULAR | Status: AC
  Filled 2024-06-29: qty 10

## 2024-06-29 MED ORDER — LIDOCAINE HCL (CARDIAC) PF 100 MG/5ML IV SOSY
PREFILLED_SYRINGE | INTRAVENOUS | Status: DC | PRN
  Administered 2024-06-29: 100 mg via INTRAVENOUS

## 2024-06-29 MED ORDER — TRAMADOL HCL 50 MG PO TABS: 50.0000 mg | ORAL_TABLET | Freq: Four times a day (QID) | ORAL | 0 refills | Status: AC | PRN

## 2024-06-29 MED ORDER — LACTATED RINGERS IV SOLN: INTRAVENOUS | Status: DC

## 2024-06-29 MED ORDER — MIDAZOLAM HCL 2 MG/2ML IJ SOLN
INTRAMUSCULAR | Status: AC
  Filled 2024-06-29: qty 2

## 2024-06-29 MED ORDER — BUPIVACAINE-EPINEPHRINE (PF) 0.5% -1:200000 IJ SOLN
INTRAMUSCULAR | Status: AC
  Filled 2024-06-29: qty 10

## 2024-06-29 MED ORDER — DEXAMETHASONE SODIUM PHOSPHATE 10 MG/ML IJ SOLN
INTRAMUSCULAR | Status: DC | PRN
Start: 2024-06-29 — End: 2024-06-29
  Administered 2024-06-29: 10 mg via INTRAVENOUS

## 2024-06-29 MED ORDER — BUPIVACAINE HCL (PF) 0.5 % IJ SOLN
INTRAMUSCULAR | Status: AC
  Filled 2024-06-29: qty 30

## 2024-06-29 MED ORDER — FENTANYL CITRATE (PF) 100 MCG/2ML IJ SOLN: 25.0000 ug | INTRAMUSCULAR | Status: DC | PRN

## 2024-06-29 MED ORDER — ONDANSETRON HCL 4 MG/2ML IJ SOLN
INTRAMUSCULAR | Status: AC
  Filled 2024-06-29: qty 2

## 2024-06-29 MED ORDER — KETOROLAC TROMETHAMINE 30 MG/ML IJ SOLN
INTRAMUSCULAR | Status: AC
  Filled 2024-06-29: qty 1

## 2024-06-29 MED ORDER — CHLORHEXIDINE GLUCONATE 0.12 % MT SOLN
15.0000 mL | Freq: Once | OROMUCOSAL | Status: AC
  Administered 2024-06-29: 15 mL via OROMUCOSAL

## 2024-06-29 MED ORDER — LIDOCAINE HCL 1 % IJ SOLN
INTRAMUSCULAR | Status: DC | PRN
  Administered 2024-06-29: 6 mL

## 2024-06-29 MED ORDER — CEFAZOLIN SODIUM-DEXTROSE 2-4 GM/100ML-% IV SOLN
INTRAVENOUS | Status: AC
  Filled 2024-06-29: qty 100

## 2024-06-29 MED ORDER — LIDOCAINE HCL (PF) 2 % IJ SOLN
INTRAMUSCULAR | Status: AC
  Filled 2024-06-29: qty 5

## 2024-06-29 SURGICAL SUPPLY — 20 items
BNDG ADH 1X3 SHEER STRL LF (GAUZE/BANDAGES/DRESSINGS) ×2 IMPLANT
BNDG ADH 2 X3.75 FABRIC TAN LF (GAUZE/BANDAGES/DRESSINGS) IMPLANT
BRUSH SCRUB EZ 4% CHG (MISCELLANEOUS) ×2 IMPLANT
CHLORAPREP W/TINT 26 (MISCELLANEOUS) ×2 IMPLANT
COVER PROBE FLX POLY STRL (MISCELLANEOUS) ×2 IMPLANT
DERMABOND ADVANCED .7 DNX12 (GAUZE/BANDAGES/DRESSINGS) ×2 IMPLANT
DRAPE EXTREMITY 106X87X128.5 (DRAPES) ×2 IMPLANT
DRAPE IMP U-DRAPE 54X76 (DRAPES) ×2 IMPLANT
DRAPE SHEET LG 3/4 BI-LAMINATE (DRAPES) ×2 IMPLANT
GLOVE BIOGEL PI IND STRL 8 (GLOVE) ×2 IMPLANT
GLOVE SRG 8 PF TXTR STRL LF DI (GLOVE) ×2 IMPLANT
GLOVE SURG SYN 7.5 PF PI (GLOVE) ×2 IMPLANT
GOWN SRG XL LVL 3 NONREINFORCE (GOWNS) ×2 IMPLANT
KIT TURNOVER KIT A (KITS) ×2 IMPLANT
NDL HYPO 25X1 1.5 SAFETY (NEEDLE) ×2 IMPLANT
NEEDLE HYPO 25X1 1.5 SAFETY (NEEDLE) ×1 IMPLANT
NS IRRIG 500ML POUR BTL (IV SOLUTION) ×2 IMPLANT
PACK BASIN MINOR ARMC (MISCELLANEOUS) ×2 IMPLANT
STOCKINETTE IMPERVIOUS 9X36 MD (GAUZE/BANDAGES/DRESSINGS) ×2 IMPLANT
ULTRAGLIDE CTR (BLADE) ×2 IMPLANT

## 2024-06-29 NOTE — Transfer of Care (Signed)
 Immediate Anesthesia Transfer of Care Note  Patient: [name]  Procedure(s) Performed: CARPAL TUNNEL RELEASE (Right)  Patient Location: PACU  Anesthesia Type:MAC  Level of Consciousness: drowsy  Airway & Oxygen Therapy: Patient Spontanous Breathing and Patient connected to face mask oxygen  Post-op Assessment: Report given to RN and Post -op Vital signs reviewed and stable  Post vital signs: Reviewed and stable  Last Vitals:  Vitals Value Taken Time  BP 123/66 06/29/24 09:27  Temp 36.3 C 06/29/24 09:27  Pulse 57 06/29/24 09:27  Resp 15 06/29/24 09:27  SpO2 100 % 06/29/24 09:27  Vitals shown include unfiled device data.  Last Pain:  Vitals:   06/29/24 0733  TempSrc: Temporal  PainSc: 0-No pain         Complications: No notable events documented.

## 2024-06-29 NOTE — Op Note (Signed)
 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  Findings: Severe compression of the median nerve at the transverse carpal ligament  Preoperative Diagnosis:G56.01 Right carpal tunnel syndrome  Postoperative Diagnosis: G56.01 Right carpal tunnel syndrome   Postoperative Diagnosis: same   EBL: Minimal IVF: See anesthesia report Drains: none Disposition:Stable to PACU Complications: none  No foley catheter was placed.   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  They had tried rest, padding, and watchful waiting but had continued progressive symptoms.  Given the progression of her median neuropathy plan was made for median nerve decompression  Risk of surgery is discussed and include: Infection, bleeding, wound healing issues, pillar pain nerve injury, pain, failure to relieve the symptoms, need for further surgery.  Procedure:  1) right sided carpal tunnel decompression with ultrasound guidance   Procedure: After obtaining informed consent, the patient taken to the operating room, placed in supine position, monitored anesthesia care was induced.  They were given preoperative antibiotics.  Prepped and draped in the usual fashion.  Comprehensive timeout was performed verifying the patient's name, MRN, planned procedure.  An ultrasound was used with a sterile probe.  We used this to mark out our safety points including the interval between the ulnar artery and median nerve.  We identified the motor branch as well as the first sensory branch which were both in safe position.  We also identified the vascular arcade which was in safe positioning as well.  At this point we placed a block with Marcaine  without epinephrine .  We blocked the skin where the incision would be, the superficial sensory median nerve, as well as  the transverse carpal ligament.  Under ultrasound guidance we utilized the local anesthetic to perform a hydrodissection of the nerve from the transverse carpal ligament.  We then prepped the sonexs ultra CTR knife on the back table while the anesthetic set in.  We performed a small linear incision approximately 2 to 3 mm.  We then utilized a Statistician under ultrasound guidance to identify the underside of the transverse carpal ligament.  The fat and connective tissue was dissected off the underside.  We could feel that this was quite thickened and calcified.  Causing severe compression.  Once we had a clear tract we then placed the ultrasound-guided knife into the incision and advanced it through the carpal tunnel.  We verified the safety zones including the median nerve which did not significantly cross over the knife.  We are able to see the first sensory branch which was not crossing the knife.  The artery was also in a safe place.  At this point we divided the transverse carpal ligament under ultrasound guidance, to get a full release it took 2 passes.  The nerve relaxed laterally and was well decompressed.  After the 2 passes we placed a [place] 4 into the wound and were able to feel a complete dissection of the transverse carpal ligament.  We then irrigated, we got meticulous hemostasis.  Skin glue was placed on the incision and a Band-Aid was placed on top once this was dried.  No immediate complications.  Sponge and pattie counts were correct at the end of the procedure.   I performed the procedure without an assistant surgeon  Penne MICAEL Sharps, MD/MSCR

## 2024-06-29 NOTE — Discharge Instructions (Addendum)
 Your surgeon has performed an operation on the nerves in your upper extremity. Many times, patients feel better immediately after surgery and can "overdo it." Even if you feel well, it is important that you follow these activity guidelines. If you do not let your nerve heal properly from the surgery, you can increase the chance of return of your symptoms. The following are instructions to help in your recovery once you have been discharged from the hospital.  It is normal for your nerves to feel increased "tingling" after surgery or a sensation that the nerve is "waking up".  This generally will resolve in a short period of time, within 1 to 2 weeks.  * It is ok to take NSAIDs after surgery.  Activity    Increase physical activity slowly as tolerated.  Taking short walks is encouraged, but avoid strenuous exercise. Do not jog, run, bicycle, lift weights, or participate in any other exercises unless specifically allowed by your doctor. Avoid prolonged sitting, including car rides.  For the first few days after surgery, avoid any use of the extremity more than a glass of water or for eating.  Specific instructions were given to you based off of your specific procedure.  You should not drive until no longer taking any new pain medication.  You may shower three days after your surgery.  After showering, lightly dab your incision dry. Do not take a tub bath or go swimming for 3 weeks, or until approved by your doctor at your follow-up appointment.  If you smoke, we strongly recommend that you quit.  Smoking has been proven to interfere with normal healing in your back and will dramatically reduce the success rate of your surgery. Please contact [facility] (800-QUIT-NOW) and use the resources at www.[facility].com for assistance in stopping smoking.  Surgical Incision   If you have a dressing on your incision, you may remove it three days after your surgery. Keep your incision area clean and dry.  Your  sutures are under your skin and your wound is covered with surgical glue.  The glue should begin to peel away within about a week.   Diet            You may return to your usual diet. Be sure to stay hydrated.  When to Contact us  Although your surgery and recovery will likely be uneventful, you may have some residual numbness, aches, and pains in your back and/or legs. This is normal and should improve in the next few weeks.  However, should you experience any of the following, contact us immediately: New numbness or weakness Pain that is progressively getting worse, and is not relieved by your pain medications or rest Bleeding, redness, swelling, pain, or drainage from surgical incision Chills or flu-like symptoms Fever greater than 101.0 F (38.3 C) Problems with bowel or bladder functions Difficulty breathing or shortness of breath Warmth, tenderness, or swelling in your calf  Contact Information How to contact us:  If you have any questions/concerns before or after surgery, you can reach [place] at [phone], or you can send a mychart message. We can be reached by phone or mychart 8am-4pm, Monday-Friday.  *Please note: Calls after 4pm are forwarded to a third party answering service. Mychart messages are not routinely monitored during evenings, weekends, and holidays. Please call our office to contact the answering service for urgent concerns during non-business hours.

## 2024-06-29 NOTE — Anesthesia Postprocedure Evaluation (Signed)
 Anesthesia Post Note  Patient: [name]  Procedure(s) Performed: CARPAL TUNNEL RELEASE (Right)  Patient location during evaluation: PACU Anesthesia Type: General Level of consciousness: awake and alert Pain management: pain level controlled Vital Signs Assessment: post-procedure vital signs reviewed and stable Respiratory status: spontaneous breathing, nonlabored ventilation, respiratory function stable and patient connected to nasal cannula oxygen Cardiovascular status: blood pressure returned to baseline and stable Postop Assessment: no apparent nausea or vomiting Anesthetic complications: no   No notable events documented.   Last Vitals:  Vitals:   06/29/24 0930 06/29/24 0945  BP: 122/69 137/71  Pulse: (!) 57 61  Resp: 15 14  Temp:    SpO2: 100% 100%    Last Pain:  Vitals:   06/29/24 0945  TempSrc:   PainSc: 0-No pain                 Debby Mines

## 2024-06-29 NOTE — Anesthesia Preprocedure Evaluation (Signed)
 Anesthesia Evaluation  Patient identified by MRN, date of birth, ID band Patient awake    Reviewed: Allergy & Precautions, NPO status , Patient's Chart, lab work & pertinent test results  History of Anesthesia Complications Negative for: history of anesthetic complications  Airway Mallampati: III  TM Distance: >3 FB Neck ROM: Full  Mouth opening: Limited Mouth Opening  Dental  (+) Caps, Dental Advisory Given   Pulmonary neg pulmonary ROS, COPD,  COPD inhaler, former smoker 12/03/2020 SARS coronavirus NEG   Pulmonary exam normal breath sounds clear to auscultation       Cardiovascular hypertension, Pt. on medications (-) angina negative cardio ROS Normal cardiovascular exam Rhythm:Regular Rate:Normal  '10 ECHO: normal LV size and function, mild Aortic sclerosis without stenosis   Neuro/Psych   Anxiety Depression    negative neurological ROS  negative psych ROS   GI/Hepatic negative GI ROS, Neg liver ROS,GERD  Medicated and Controlled,,  Endo/Other  negative endocrine ROS  obese  Renal/GU negative Renal ROS  negative genitourinary   Musculoskeletal   Abdominal   Peds  Hematology negative hematology ROS (+)   Anesthesia Other Findings Past Medical History: No date: Anemia No date: Anxiety     Comment:  a.) on BZO (alprazolam ) PRN No date: Arthritis No date: Asthma     Comment:  seasonal No date: Bilateral carpal tunnel syndrome No date: Depression No date: Dyspnea No date: GERD (gastroesophageal reflux disease) No date: HLD (hyperlipidemia) No date: Hypertension No date: RBBB (right bundle branch block) No date: Thoracic spondylosis  Past Surgical History: 7- 10 yrs ago: ANKLE SURGERY; Right 3 yrs ago: APPENDECTOMY No date: CHOLECYSTECTOMY No date: COLONOSCOPY  68yrs ago: KNEE SURGERY; Bilateral     Comment:  Arthroscopic 05/02/2013: TOTAL HIP ARTHROPLASTY; Right     Comment:  Procedure: RIGHT TOTAL  HIP ARTHROPLASTY ANTERIOR               APPROACH;  Surgeon: Donnice JONETTA Car, MD;  Location: [facility]               ORS;  Service: Orthopedics;  Laterality: Right; 09/26/2019: TOTAL KNEE ARTHROPLASTY; Right     Comment:  Procedure: TOTAL KNEE ARTHROPLASTY;  Surgeon: Car Donnice, MD;  Location: [facility] ORS;  Service: Orthopedics;                Laterality: Right;  70 mins 12/03/2020: TOTAL KNEE ARTHROPLASTY; Left     Comment:  Procedure: TOTAL KNEE ARTHROPLASTY;  Surgeon: Car Donnice, MD;  Location: [facility] ORS;  Service: Orthopedics;                Laterality: Left;      Reproductive/Obstetrics negative OB ROS                              Anesthesia Physical Anesthesia Plan  ASA: 2  Anesthesia Plan: General   Post-op Pain Management:    Induction: Intravenous  PONV Risk Score and Plan: 3 and Propofol  infusion, TIVA and Ondansetron   Airway Management Planned: Natural Airway and Nasal Cannula  Additional Equipment:   Intra-op Plan:   Post-operative Plan:   Informed Consent: I have reviewed the patients History and Physical, chart, labs and discussed the procedure including the risks, benefits and alternatives for the proposed  anesthesia with the patient or authorized representative who has indicated his/her understanding and acceptance.     Dental Advisory Given  Plan Discussed with: Anesthesiologist, CRNA and Surgeon  Anesthesia Plan Comments: (Patient consented for risks of anesthesia including but not limited to:  - adverse reactions to medications - risk of airway placement if required - damage to eyes, teeth, lips or other oral mucosa - nerve damage due to positioning  - sore throat or hoarseness - Damage to heart, brain, nerves, lungs, other parts of body or loss of life  Patient voiced understanding and assent.)        Anesthesia Quick Evaluation

## 2024-06-29 NOTE — Interval H&P Note (Signed)
 History and Physical Interval Note:  06/29/2024 8:49 AM  [name]  has presented today for surgery, with the diagnosis of G56.01 Right carpal tunnel syndrome.  The various methods of treatment have been discussed with the patient and family. After consideration of risks, benefits and other options for treatment, the patient has consented to  Procedure(s) with comments: CARPAL TUNNEL RELEASE (Right) - RIGHT CARPAL TUNNEL RELEASE WITH ULTRASOUND GUIDANCE, POSSIBLE CONVERT TO OPEN as a surgical intervention.  The patient's history has been reviewed, patient examined, no change in status, stable for surgery.  I have reviewed the patient's chart and labs.  Questions were answered to the patient's satisfaction.    Heart and lungs clear   [name]

## 2024-06-30 ENCOUNTER — Encounter: Payer: Self-pay | Admitting: Neurosurgery

## 2024-06-30 ENCOUNTER — Telehealth: Payer: Self-pay | Admitting: Neurosurgery

## 2024-06-30 NOTE — Telephone Encounter (Signed)
 right carpal tunnel release with ultrasound guidance, possible convert to open on 06/29/24; 2) left carpal tunnel release with ultrasound guidance, possible convert to open on 08/01/24   Patient is calling to ask what her restrictions and limitations are following surgery. She wants to know if she is able to lift more than a drink cup and if she can bend her wrist or needs to keep it straight? She states that Dr. Claudene tried to call her husband's phone yesterday evening but his phone did not recognize the number and blocked the call.

## 2024-06-30 NOTE — Telephone Encounter (Signed)
 Patient is calling back to follow up on her restrictions.

## 2024-07-12 ENCOUNTER — Ambulatory Visit (INDEPENDENT_AMBULATORY_CARE_PROVIDER_SITE_OTHER): Admitting: Physician Assistant

## 2024-07-12 VITALS — Temp 98.6°F | Wt 192.0 lb

## 2024-07-12 DIAGNOSIS — Z09 Encounter for follow-up examination after completed treatment for conditions other than malignant neoplasm: Secondary | ICD-10-CM

## 2024-07-12 DIAGNOSIS — G5601 Carpal tunnel syndrome, right upper limb: Secondary | ICD-10-CM

## 2024-07-12 NOTE — Progress Notes (Signed)
   REFERRING PHYSICIAN:  Ransom Other, Md [address] [place],  [place] [address]  DOS: 06/29/24, right ultrasound-guided carpal tunnel release  HISTORY OF PRESENT ILLNESS: [name] is 2 weeks status post right ultrasound-guided carpal tunnel release on 06/29/2024. Overall, she is doing well.  Numbness and tingling has improved some as well as sharp shooting pain she is experiencing.  She continues to have weakness and is dropping things often.  She continues to want to move forward with her left ultrasound-guided carpal tunnel release in approximately 3 weeks.    PHYSICAL EXAMINATION:  NEUROLOGICAL:  General: In no acute distress.   Awake, alert, oriented to person, place, and time.  Pupils equal round and reactive to light.  Facial tone is symmetric.    Strength: Wasting continues to be noted in the thenar eminence bilaterally.  Median nerve and intrinsic muscles are 4/5 throughout. Incision c/d/I  Imaging:  No new imaging  Assessment / Plan: [name] is 2 weeks status post right ultrasound-guided carpal tunnel release on 06/29/2024. Overall, she is doing well.  Numbness and tingling has improved some as well as sharp shooting pain she is experiencing.  She continues to have weakness and is dropping things often.  She continues to want to move forward with her left ultrasound-guided carpal tunnel release in approximately 3 weeks.    Advised to contact the office if any questions or concerns arise.   Lyle Decamp PA-C Dept of Neurosurgery

## 2024-07-24 ENCOUNTER — Encounter
Admission: RE | Admit: 2024-07-24 | Discharge: 2024-07-24 | Disposition: A | Discharge status: Home / Self Care | Source: Ambulatory Visit | Attending: Neurosurgery | Admitting: Neurosurgery

## 2024-07-24 ENCOUNTER — Inpatient Hospital Stay: Admission: RE | Admit: 2024-07-24 | Source: Ambulatory Visit

## 2024-07-24 NOTE — Pre-Procedure Instructions (Signed)
 PAT follow up completed

## 2024-07-24 NOTE — Patient Instructions (Addendum)
 Your procedure is scheduled on: 08/01/24 - Tuesday Report to the Registration Desk on the 1st floor of the [facility]. To find out your arrival time, please call [phone] between 1PM - 3PM on: 07/31/24 - Monday If your arrival time is 6:00 am, do not arrive before that time as the [facility] entrance doors do not open until 6:00 am.  REMEMBER: Instructions that are not followed completely may result in serious medical risk, up to and including death; or upon the discretion of your surgeon and anesthesiologist your surgery may need to be rescheduled.  Do not eat food after midnight the night before surgery.  No gum chewing or hard candies.  You may however, drink CLEAR liquids up to 2 hours before you are scheduled to arrive for your surgery. Do not drink anything within 2 hours of your scheduled arrival time.  Clear liquids include: - water   - apple juice without pulp - gatorade (not RED colors) - black coffee or tea (Do NOT add milk or creamers to the coffee or tea) Do NOT drink anything that is not on this list.  One week prior to surgery: Stop Anti-inflammatories (NSAIDS) such as Advil, Aleve, Ibuprofen, Motrin, Naproxen, Naprosyn and Aspirin  based products such as Excedrin, Goody's Powder, BC Powder. You may take Tylenol  if needed for pain up until the day of surgery.   Stop ANY OVER THE COUNTER supplements until after surgery : VITAMIN D     ON THE DAY OF SURGERY ONLY TAKE THESE MEDICATIONS WITH SIPS OF WATER :   diltiazem  (CARDIZEM  CD)  DULoxetine  (CYMBALTA )  omeprazole  (PRILOSEC)  simvastatin  (ZOCOR )  ALPRAZolam  (XANAX )    Use albuterol  (VENTOLIN  HFA)  on the day of surgery and bring to the hospital.    No Alcohol for 24 hours before or after surgery.  No Smoking including e-cigarettes for 24 hours before surgery.  No chewable tobacco products for at least 6 hours before surgery.  No nicotine patches on the day of surgery.  Do not use any recreational drugs  for at least a week (preferably 2 weeks) before your surgery.  Please be advised that the combination of cocaine and anesthesia may have negative outcomes, up to and including death. If you test positive for cocaine, your surgery will be cancelled.  On the morning of surgery brush your teeth with toothpaste and water , you may rinse your mouth with mouthwash if you wish. Do not swallow any toothpaste or mouthwash.  Use CHG Soap or wipes as directed on instruction sheet.  Do not wear jewelry, make-up, hairpins, clips or nail polish.  For welded (permanent) jewelry: bracelets, anklets, waist bands, etc.  Please have this removed prior to surgery.  If it is not removed, there is a chance that hospital personnel will need to cut it off on the day of surgery.  Do not wear lotions, powders, or perfumes.   Do not shave body hair from the neck down 48 hours before surgery.  Contact lenses, hearing aids and dentures may not be worn into surgery.  Do not bring valuables to the hospital. [facility] is not responsible for any missing/lost belongings or valuables.   Notify your doctor if there is any change in your medical condition (cold, fever, infection).  Wear comfortable clothing (specific to your surgery type) to the hospital.  After surgery, you can help prevent lung complications by doing breathing exercises.  Take deep breaths and cough every 1-2 hours. Your doctor may order a device called  an Facilities manager to help you take deep breaths.  When coughing or sneezing, hold a pillow firmly against your incision with both hands. This is called "splinting." Doing this helps protect your incision. It also decreases belly discomfort.  If you are being admitted to the hospital overnight, leave your suitcase in the car. After surgery it may be brought to your room.  In case of increased patient census, it may be necessary for you, the patient, to continue your postoperative care in the Same  Day Surgery department.  If you are being discharged the day of surgery, you will not be allowed to drive home. You will need a responsible individual to drive you home and stay with you for 24 hours after surgery.   If you are taking public transportation, you will need to have a responsible individual with you.  Please call the Pre-admissions Testing Dept. at [phone] if you have any questions about these instructions.  Surgery Visitation Policy:  Patients having surgery or a procedure may have two visitors.  Children under the age of 16 must have an adult with them who is not the patient.  Inpatient Visitation:    Visiting hours are 7 a.m. to 8 p.m. Up to four visitors are allowed at one time in a patient room. The visitors may rotate out with other people during the day.  One visitor age 72 or older may stay with the patient overnight and must be in the room by 8 p.m.   Merchandiser, retail to address health-related social needs:  [email or web address]

## 2024-07-31 DIAGNOSIS — M1612 Unilateral primary osteoarthritis, left hip: Secondary | ICD-10-CM | POA: Diagnosis not present

## 2024-07-31 NOTE — Patient Instructions (Signed)
    Preparing for Surgery with CHLORHEXIDINE GLUCONATE (CHG) Soap  Chlorhexidine Gluconate (CHG) Soap  o An antiseptic cleaner that kills germs and bonds with the skin to continue killing germs even after washing  o Used for showering the night before surgery and morning of surgery  Before surgery, you can play an important role by reducing the number of germs on your skin.  CHG (Chlorhexidine gluconate) soap is an antiseptic cleanser which kills germs and bonds with the skin to continue killing germs even after washing.  Please do not use if you have an allergy to CHG or antibacterial soaps. If your skin becomes reddened/irritated stop using the CHG.  1. Shower the NIGHT BEFORE SURGERY and the MORNING OF SURGERY with CHG soap.  2. If you choose to wash your hair, wash your hair first as usual with your normal shampoo.  3. After shampooing, rinse your hair and body thoroughly to remove the shampoo.  4. Use CHG as you would any other liquid soap. You can apply CHG directly to the skin and wash gently with a scrungie or a clean washcloth.  5. Apply the CHG soap to your body only from the neck down. Do not use on open wounds or open sores. Avoid contact with your eyes, ears, mouth, and genitals (private parts). Wash face and genitals (private parts) with your normal soap.  6. Wash thoroughly, paying special attention to the area where your surgery will be performed.  7. Thoroughly rinse your body with warm water.  8. Do not shower/wash with your normal soap after using and rinsing off the CHG soap.  9. Pat yourself dry with a clean towel.  10. Wear clean pajamas to bed the night before surgery.  12. Place clean sheets on your bed the night of your first shower and do not sleep with pets.  13. Shower again with the CHG soap on the day of surgery prior to arriving at the hospital.  14. Do not apply any deodorants/lotions/powders.  15. Please wear clean clothes to the  hospital.

## 2024-08-01 ENCOUNTER — Ambulatory Visit

## 2024-08-01 ENCOUNTER — Encounter
Admission: RE | Disposition: A | Discharge status: Home / Self Care | Payer: Self-pay | Source: Ambulatory Visit | Attending: Neurosurgery

## 2024-08-01 ENCOUNTER — Other Ambulatory Visit: Payer: Self-pay

## 2024-08-01 ENCOUNTER — Encounter: Payer: Self-pay | Admitting: Neurosurgery

## 2024-08-01 ENCOUNTER — Ambulatory Visit: Payer: Self-pay | Admitting: Urgent Care

## 2024-08-01 ENCOUNTER — Ambulatory Visit
Admission: RE | Admit: 2024-08-01 | Discharge: 2024-08-01 | Disposition: A | Discharge status: Home / Self Care | Source: Ambulatory Visit | Attending: Neurosurgery | Admitting: Neurosurgery

## 2024-08-01 DIAGNOSIS — Z87891 Personal history of nicotine dependence: Secondary | ICD-10-CM | POA: Insufficient documentation

## 2024-08-01 DIAGNOSIS — K219 Gastro-esophageal reflux disease without esophagitis: Secondary | ICD-10-CM | POA: Diagnosis not present

## 2024-08-01 DIAGNOSIS — G5602 Carpal tunnel syndrome, left upper limb: Secondary | ICD-10-CM | POA: Insufficient documentation

## 2024-08-01 DIAGNOSIS — I10 Essential (primary) hypertension: Secondary | ICD-10-CM | POA: Insufficient documentation

## 2024-08-01 DIAGNOSIS — Z01818 Encounter for other preprocedural examination: Secondary | ICD-10-CM

## 2024-08-01 DIAGNOSIS — J45909 Unspecified asthma, uncomplicated: Secondary | ICD-10-CM | POA: Insufficient documentation

## 2024-08-01 HISTORY — PX: CARPAL TUNNEL RELEASE: SHX101

## 2024-08-01 SURGERY — CARPAL TUNNEL RELEASE
Anesthesia: General | Laterality: Left

## 2024-08-01 MED ORDER — HYDROCODONE-ACETAMINOPHEN 5-325 MG PO TABS
1.0000 | ORAL_TABLET | Freq: Four times a day (QID) | ORAL | 0 refills | Status: AC | PRN
  Filled 2024-08-01: qty 20, 5d supply, fill #0

## 2024-08-01 MED ORDER — ORAL CARE MOUTH RINSE: 15.0000 mL | Freq: Once | OROMUCOSAL | Status: AC

## 2024-08-01 MED ORDER — SODIUM CHLORIDE 0.9 % IV SOLN: INTRAVENOUS | Status: DC

## 2024-08-01 MED ORDER — MIDAZOLAM HCL 2 MG/2ML IJ SOLN
INTRAMUSCULAR | Status: DC | PRN
  Administered 2024-08-01: 2 mg via INTRAVENOUS

## 2024-08-01 MED ORDER — ONDANSETRON HCL 4 MG/2ML IJ SOLN
INTRAMUSCULAR | Status: DC | PRN
  Administered 2024-08-01: 4 mg via INTRAVENOUS

## 2024-08-01 MED ORDER — PROPOFOL 1000 MG/100ML IV EMUL
INTRAVENOUS | Status: AC
  Filled 2024-08-01: qty 100

## 2024-08-01 MED ORDER — LIDOCAINE HCL (PF) 1 % IJ SOLN
INTRAMUSCULAR | Status: AC
  Filled 2024-08-01: qty 30

## 2024-08-01 MED ORDER — FENTANYL CITRATE (PF) 100 MCG/2ML IJ SOLN: 25.0000 ug | INTRAMUSCULAR | Status: DC | PRN

## 2024-08-01 MED ORDER — 0.9 % SODIUM CHLORIDE (POUR BTL) OPTIME
TOPICAL | Status: DC | PRN
  Administered 2024-08-01: 500 mL

## 2024-08-01 MED ORDER — FENTANYL CITRATE (PF) 100 MCG/2ML IJ SOLN
INTRAMUSCULAR | Status: AC
  Filled 2024-08-01: qty 2

## 2024-08-01 MED ORDER — OXYCODONE HCL 5 MG PO TABS: 5.0000 mg | ORAL_TABLET | Freq: Once | ORAL | Status: DC | PRN

## 2024-08-01 MED ORDER — CHLORHEXIDINE GLUCONATE 0.12 % MT SOLN
OROMUCOSAL | Status: AC
  Filled 2024-08-01: qty 15

## 2024-08-01 MED ORDER — MIDAZOLAM HCL 2 MG/2ML IJ SOLN
INTRAMUSCULAR | Status: AC
  Filled 2024-08-01: qty 2

## 2024-08-01 MED ORDER — TRAMADOL HCL 50 MG PO TABS
50.0000 mg | ORAL_TABLET | Freq: Four times a day (QID) | ORAL | 0 refills | Status: DC | PRN
  Filled 2024-08-01: qty 20, 5d supply, fill #0

## 2024-08-01 MED ORDER — LIDOCAINE HCL 1 % IJ SOLN
INTRAMUSCULAR | Status: DC | PRN
  Administered 2024-08-01: 3 mL

## 2024-08-01 MED ORDER — CEFAZOLIN IN SODIUM CHLORIDE 2-0.9 GM/100ML-% IV SOLN
2.0000 g | Freq: Once | INTRAVENOUS | Status: DC
  Filled 2024-08-01: qty 100

## 2024-08-01 MED ORDER — OXYCODONE HCL 5 MG/5ML PO SOLN: 5.0000 mg | Freq: Once | ORAL | Status: DC | PRN

## 2024-08-01 MED ORDER — CHLORHEXIDINE GLUCONATE 0.12 % MT SOLN
15.0000 mL | Freq: Once | OROMUCOSAL | Status: AC
  Administered 2024-08-01: 15 mL via OROMUCOSAL

## 2024-08-01 MED ORDER — PROPOFOL 10 MG/ML IV BOLUS
INTRAVENOUS | Status: AC
Start: 2024-08-01 — End: 2024-08-01
  Filled 2024-08-01: qty 20

## 2024-08-01 MED ORDER — CEFAZOLIN SODIUM-DEXTROSE 2-4 GM/100ML-% IV SOLN
INTRAVENOUS | Status: AC
  Filled 2024-08-01: qty 100

## 2024-08-01 MED ORDER — CEFAZOLIN SODIUM-DEXTROSE 2-3 GM-%(50ML) IV SOLR
INTRAVENOUS | Status: DC | PRN
  Administered 2024-08-01: 2 g via INTRAVENOUS

## 2024-08-01 MED ORDER — LIDOCAINE HCL (CARDIAC) PF 100 MG/5ML IV SOSY
PREFILLED_SYRINGE | INTRAVENOUS | Status: DC | PRN
  Administered 2024-08-01: 60 mg via INTRAVENOUS

## 2024-08-01 MED ORDER — LIDOCAINE HCL (PF) 2 % IJ SOLN
INTRAMUSCULAR | Status: AC
  Filled 2024-08-01: qty 5

## 2024-08-01 MED ORDER — PROPOFOL 500 MG/50ML IV EMUL
INTRAVENOUS | Status: DC | PRN
  Administered 2024-08-01: 75 ug/kg/min via INTRAVENOUS

## 2024-08-01 MED ORDER — FENTANYL CITRATE (PF) 100 MCG/2ML IJ SOLN
INTRAMUSCULAR | Status: DC | PRN
  Administered 2024-08-01 (×2): 50 ug via INTRAVENOUS

## 2024-08-01 SURGICAL SUPPLY — 20 items
BNDG ADH 1X3 SHEER STRL LF (GAUZE/BANDAGES/DRESSINGS) ×2 IMPLANT
BNDG ADH 2 X3.75 FABRIC TAN LF (GAUZE/BANDAGES/DRESSINGS) IMPLANT
BRUSH SCRUB EZ 4% CHG (MISCELLANEOUS) ×2 IMPLANT
CHLORAPREP W/TINT 26 (MISCELLANEOUS) ×2 IMPLANT
COVER PROBE FLX POLY STRL (MISCELLANEOUS) ×2 IMPLANT
DERMABOND ADVANCED .7 DNX12 (GAUZE/BANDAGES/DRESSINGS) ×2 IMPLANT
DRAPE EXTREMITY 106X87X128.5 (DRAPES) ×2 IMPLANT
DRAPE IMP U-DRAPE 54X76 (DRAPES) ×2 IMPLANT
DRAPE SHEET LG 3/4 BI-LAMINATE (DRAPES) ×2 IMPLANT
GLOVE BIOGEL PI IND STRL 8 (GLOVE) ×2 IMPLANT
GLOVE SRG 8 PF TXTR STRL LF DI (GLOVE) ×2 IMPLANT
GLOVE SURG SYN 7.5 PF PI (GLOVE) ×2 IMPLANT
GOWN SRG XL LVL 3 NONREINFORCE (GOWNS) ×2 IMPLANT
KIT TURNOVER KIT A (KITS) ×2 IMPLANT
NDL HYPO 25X1 1.5 SAFETY (NEEDLE) ×2 IMPLANT
NEEDLE HYPO 25X1 1.5 SAFETY (NEEDLE) ×1 IMPLANT
NS IRRIG 500ML POUR BTL (IV SOLUTION) ×2 IMPLANT
PACK BASIN MINOR ARMC (MISCELLANEOUS) ×2 IMPLANT
STOCKINETTE IMPERVIOUS 9X36 MD (GAUZE/BANDAGES/DRESSINGS) ×2 IMPLANT
ULTRAGLIDE CTR (BLADE) ×2 IMPLANT

## 2024-08-01 NOTE — Transfer of Care (Signed)
 Immediate Anesthesia Transfer of Care Note  Patient: [name]  Procedure(s) Performed: CARPAL TUNNEL RELEASE (Left)  Patient Location: PACU  Anesthesia Type:MAC and General  Level of Consciousness: awake  Airway & Oxygen Therapy: Patient Spontanous Breathing and Patient connected to nasal cannula oxygen  Post-op Assessment: Report given to RN  Post vital signs: Reviewed and stable  Last Vitals:  Vitals Value Taken Time  BP 129/66 08/01/24 07:45  Temp 36.1 C 08/01/24 07:41  Pulse 58 08/01/24 07:50  Resp 9 08/01/24 07:50  SpO2 98 % 08/01/24 07:50  Vitals shown include unfiled device data.  Last Pain:  Vitals:   08/01/24 0741  PainSc: Asleep         Complications: No notable events documented.

## 2024-08-01 NOTE — Anesthesia Postprocedure Evaluation (Signed)
 Anesthesia Post Note  Patient: [name]  Procedure(s) Performed: CARPAL TUNNEL RELEASE (Left)  Patient location during evaluation: PACU Anesthesia Type: General Level of consciousness: awake and alert Pain management: pain level controlled Vital Signs Assessment: post-procedure vital signs reviewed and stable Respiratory status: spontaneous breathing, nonlabored ventilation and respiratory function stable Cardiovascular status: blood pressure returned to baseline and stable Postop Assessment: no apparent nausea or vomiting Anesthetic complications: no   No notable events documented.   Last Vitals:  Vitals:   08/01/24 0815 08/01/24 0829  BP: 127/66 138/75  Pulse: (!) 56 (!) 57  Resp: 10   Temp: (!) 36.1 C (!) 36.2 C  SpO2: 94% 94%    Last Pain:  Vitals:   08/01/24 0829  PainSc: 0-No pain                 Fairy POUR Pratt Bress

## 2024-08-01 NOTE — Anesthesia Preprocedure Evaluation (Signed)
 Anesthesia Evaluation  Patient identified by MRN, date of birth, ID band Patient awake    Reviewed: Allergy & Precautions, NPO status , Patient's Chart, lab work & pertinent test results  History of Anesthesia Complications Negative for: history of anesthetic complications  Airway Mallampati: III  TM Distance: <3 FB Neck ROM: full    Dental  (+) Chipped   Pulmonary shortness of breath, asthma , former smoker   Pulmonary exam normal        Cardiovascular Exercise Tolerance: Good hypertension, Normal cardiovascular exam+ dysrhythmias      Neuro/Psych  PSYCHIATRIC DISORDERS       Neuromuscular disease    GI/Hepatic Neg liver ROS,GERD  Controlled,,  Endo/Other  negative endocrine ROS    Renal/GU negative Renal ROS  negative genitourinary   Musculoskeletal   Abdominal   Peds  Hematology negative hematology ROS (+)   Anesthesia Other Findings Past Medical History: No date: Anemia No date: Anxiety     Comment:  a.) on BZO (alprazolam ) PRN No date: Arthritis No date: Asthma     Comment:  seasonal No date: Bilateral carpal tunnel syndrome No date: Depression No date: Dyspnea No date: GERD (gastroesophageal reflux disease) No date: HLD (hyperlipidemia) No date: Hypertension No date: RBBB (right bundle branch block) No date: Thoracic spondylosis  Past Surgical History: 7- 10 yrs ago: ANKLE SURGERY; Right 3 yrs ago: APPENDECTOMY 06/29/2024: CARPAL TUNNEL RELEASE; Right     Comment:  Procedure: CARPAL TUNNEL RELEASE;  Surgeon: Claudene Penne ORN, MD;  Location: [facility] ORS;  Service:               Neurosurgery;  Laterality: Right;  RIGHT CARPAL TUNNEL               RELEASE WITH ULTRASOUND GUIDANCE, POSSIBLE CONVERT TO               OPEN No date: CHOLECYSTECTOMY No date: COLONOSCOPY  6yrs ago: KNEE SURGERY; Bilateral     Comment:  Arthroscopic 05/02/2013: TOTAL HIP ARTHROPLASTY; Right     Comment:   Procedure: RIGHT TOTAL HIP ARTHROPLASTY ANTERIOR               APPROACH;  Surgeon: Donnice JONETTA Car, MD;  Location: [facility]               [facility];  Service: Orthopedics;  Laterality: Right; 09/26/2019: TOTAL KNEE ARTHROPLASTY; Right     Comment:  Procedure: TOTAL KNEE ARTHROPLASTY;  Surgeon: Car Donnice, MD;  Location: [facility];  Service: Orthopedics;                Laterality: Right;  70 mins 12/03/2020: TOTAL KNEE ARTHROPLASTY; Left     Comment:  Procedure: TOTAL KNEE ARTHROPLASTY;  Surgeon: Car Donnice, MD;  Location: [facility];  Service: Orthopedics;                Laterality: Left;   BMI    Body Mass Index: 32.45 kg/m      Reproductive/Obstetrics negative OB ROS                              Anesthesia Physical Anesthesia Plan  ASA: 2  Anesthesia Plan: General  Post-op Pain Management:    Induction: Intravenous  PONV Risk Score and Plan: Propofol  infusion and TIVA  Airway Management Planned: Natural Airway and Nasal Cannula  Additional Equipment:   Intra-op Plan:   Post-operative Plan:   Informed Consent: I have reviewed the patients History and Physical, chart, labs and discussed the procedure including the risks, benefits and alternatives for the proposed anesthesia with the patient or authorized representative who has indicated his/her understanding and acceptance.     Dental Advisory Given  Plan Discussed with: Anesthesiologist, CRNA and Surgeon  Anesthesia Plan Comments: (Patient consented for risks of anesthesia including but not limited to:  - adverse reactions to medications - risk of airway placement if required - damage to eyes, teeth, lips or other oral mucosa - nerve damage due to positioning  - sore throat or hoarseness - Damage to heart, brain, nerves, lungs, other parts of body or loss of life  Patient voiced understanding and assent.)        Anesthesia Quick Evaluation

## 2024-08-01 NOTE — H&P (View-Only) (Signed)
 Referring Physician:  Claudene Penne ORN, MD [address] [place],  [place] [address]  Primary Physician:  Ransom Other, MD  History of Present Illness: 08/01/2024 [name] is here today with a chief complaint of severe bilateral carpal tunnel syndrome.  She states that she has probably had symptoms for at least the past multiple years.  This has been interfering with her life now that she is having difficulty with fine motor functions, opening bottles, utilizing buttons, and with utilizing her hand for writing.  She is also been dropping things.  She wakes up nightly having to wring out her hands.  This has become quite discomforting.  She had a recent EMG earlier in the month with Dr. Onita which demonstrated severe bilateral carpal tunnel syndrome.   Conservative measures:  Physical therapy: No Occupational Therapy: No Hand Therapy: No Injections: No Gabapentin: No, Lyrica: No, Cymbalta : Yes Past Surgery: No  Review of Systems:  A 10 point review of systems is negative, except for the pertinent positives and negatives detailed in the HPI.  Past Medical History: Past Medical History:  Diagnosis Date   Anemia    Anxiety    a.) on BZO (alprazolam ) PRN   Arthritis    Asthma    seasonal   Bilateral carpal tunnel syndrome    Depression    Dyspnea    GERD (gastroesophageal reflux disease)    HLD (hyperlipidemia)    Hypertension    RBBB (right bundle branch block)    Thoracic spondylosis     Past Surgical History: Past Surgical History:  Procedure Laterality Date   ANKLE SURGERY Right 7- 10 yrs ago   APPENDECTOMY  3 yrs ago   CARPAL TUNNEL RELEASE Right 06/29/2024   Procedure: CARPAL TUNNEL RELEASE;  Surgeon: Claudene Penne ORN, MD;  Location: [facility] ORS;  Service: Neurosurgery;  Laterality: Right;  RIGHT CARPAL TUNNEL RELEASE WITH ULTRASOUND GUIDANCE, POSSIBLE CONVERT TO OPEN   CHOLECYSTECTOMY     COLONOSCOPY     KNEE SURGERY Bilateral  52yrs ago    Arthroscopic   TOTAL HIP ARTHROPLASTY Right 05/02/2013   Procedure: RIGHT TOTAL HIP ARTHROPLASTY ANTERIOR APPROACH;  Surgeon: Donnice JONETTA Car, MD;  Location: [facility];  Service: Orthopedics;  Laterality: Right;   TOTAL KNEE ARTHROPLASTY Right 09/26/2019   Procedure: TOTAL KNEE ARTHROPLASTY;  Surgeon: Car Donnice, MD;  Location: [facility];  Service: Orthopedics;  Laterality: Right;  70 mins   TOTAL KNEE ARTHROPLASTY Left 12/03/2020   Procedure: TOTAL KNEE ARTHROPLASTY;  Surgeon: Car Donnice, MD;  Location: [facility];  Service: Orthopedics;  Laterality: Left;     Allergies: Allergies as of 06/21/2024 - Review Complete 06/21/2024  Allergen Reaction Noted   Dilaudid  [hydromorphone ]  11/25/2020   Codeine Palpitations 10/08/2011    Medications:  Current Facility-Administered Medications:    0.9 %  sodium chloride  infusion, , Intravenous, Continuous, Piscitello, Fairy POUR, MD, Last Rate: 10 mL/hr at 08/01/24 0639, New Bag at 08/01/24 9360   ceFAZolin  (ANCEF ) IVPB 2g/100 mL premix, 2 g, Intravenous, Once, Claudene Penne ORN, MD  Social History: Social History   Tobacco Use   Smoking status: Former    Current packs/day: 0.00    Average packs/day: 1 pack/day for 10.0 years (10.0 ttl pk-yrs)    Types: Cigarettes    Start date: 03/30/1969    Quit date: 03/31/1979    Years since quitting: 45.3   Smokeless tobacco: Never  Vaping Use   Vaping status: Never  Used  Substance Use Topics   Alcohol use: Yes    Comment: very rare   Drug use: No    Family Medical History: Family History  Problem Relation Age of Onset   Diabetes Mother    Hypertension Mother    Breast cancer Mother 15   Hypertension Father    Diabetes Maternal Grandmother    Colon cancer Maternal Grandfather    Cancer Maternal Grandfather        rectal-colon   Heart disease Paternal Grandmother    Heart disease Paternal Grandfather     Physical Examination: Vitals:   08/01/24 0619  BP: (!) 159/78  Pulse: 65  Resp:  16  Temp: (!) 96.9 F (36.1 C)  SpO2: 98%    NEUROLOGICAL:     Awake, alert, oriented to person, place, and time.  Speech is clear and fluent.   Motor Exam:  She has definite wasting in the thenar eminence bilaterally.  She has weakness with her LOF muscles bilaterally.  These are approximately 4 out of 5 throughout.  C6 reflexes present bilaterally  She has some allodynia in the median nerve distribution bilaterally, however decreased to point discrimination when compared to her ulnar distribution bilaterally.  Medical Decision Making  Imaging: None  Electrodiagnostics: Full Name:     [name]                   Gender:               Female MRN #:           [id number]                      Date of Birth:      10-11-1951    Visit Date:                            06/07/2024 12:29 Age:                                     73 Years Examining Physician:        Onita Duos Referring Physician:          Dr. Mila Height:                                 5 feet 4 inch History: 73 year old female complains gradual onset bilateral hands numbness, weakness,   Summary of the test: Nerve conduction study: Right sural, superficial peroneal sensory responses were normal.   Right peroneal to EDB, tibial motor responses were normal.   Bilateral ulnar sensory and motor responses were within normal limit.  Bilateral radial sensory responses were normal.   Bilateral median sensory and motor responses were absent.   Electromyography: Selected needle examinations were performed at bilateral upper extremity muscles and cervical paraspinal muscles.   The abnormality is limited to bilateral abductor pollicis brevis, evidence of active denervation, and large complex motor unit potential with decreased recruitment patterns.     Conclusion: This is an abnormal study.  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  There is evidence of active denervation, axonal loss.       ------------------------------- Duos Onita. M.D. Ph.D.  [facility] [address] [place], [place] [address] Tel: [phone] Fax: [phone]  I have personally reviewed the images and electrodiagnostics and agree with the above interpretation.  Assessment and Plan: Ms. Gallion is a pleasant 73 y.o. female with carpal tunnel syndrome on the right hand carpal tunnel syndrome on the left.  She states that her right hand has becoming progressively worse over the past multiple years.  Its become acutely worse over the past 1 year.  She has noticed progressive loss of function bilaterally.  Loss of sensation causing her to drop things.  She is also had worsening waking symptoms.   Left-sided carpal tunnel syndrome -this is also electrodiagnostically severe.  Also has motor wasting.  Slightly less severe motor weakness on this side however still profound.  She had the contralateral side done previously, and is here now for the other side.   Thank you for involving me in the care of this patient.    Penne MICAEL Sharps MD/MSCR Neurosurgery - Peripheral Nerve Surgery

## 2024-08-01 NOTE — Interval H&P Note (Signed)
 History and Physical Interval Note:  08/01/2024 7:03 AM  [name]  has presented today for surgery, with the diagnosis of G56.02 Left carpal tunnel syndrome.  The various methods of treatment have been discussed with the patient and family. After consideration of risks, benefits and other options for treatment, the patient has consented to  Procedure(s) with comments: CARPAL TUNNEL RELEASE (Left) - LEFT CARPAL TUNNEL RELEASE WITH ULTRASOUND GUIDANCE, POSSIBLE CONVERT TO OPEN as a surgical intervention.  The patient's history has been reviewed, patient examined, no change in status, stable for surgery.  I have reviewed the patient's chart and labs.  Questions were answered to the patient's satisfaction.    Heart and lungs clear  [name]

## 2024-08-01 NOTE — Progress Notes (Signed)
 Referring Physician:  Claudene Penne ORN, MD [address] [place],  [place] [address]  Primary Physician:  Ransom Other, MD  History of Present Illness: 08/01/2024 [name] is here today with a chief complaint of severe bilateral carpal tunnel syndrome.  She states that she has probably had symptoms for at least the past multiple years.  This has been interfering with her life now that she is having difficulty with fine motor functions, opening bottles, utilizing buttons, and with utilizing her hand for writing.  She is also been dropping things.  She wakes up nightly having to wring out her hands.  This has become quite discomforting.  She had a recent EMG earlier in the month with Dr. Onita which demonstrated severe bilateral carpal tunnel syndrome.   Conservative measures:  Physical therapy: No Occupational Therapy: No Hand Therapy: No Injections: No Gabapentin: No, Lyrica: No, Cymbalta : Yes Past Surgery: No  Review of Systems:  A 10 point review of systems is negative, except for the pertinent positives and negatives detailed in the HPI.  Past Medical History: Past Medical History:  Diagnosis Date   Anemia    Anxiety    a.) on BZO (alprazolam ) PRN   Arthritis    Asthma    seasonal   Bilateral carpal tunnel syndrome    Depression    Dyspnea    GERD (gastroesophageal reflux disease)    HLD (hyperlipidemia)    Hypertension    RBBB (right bundle branch block)    Thoracic spondylosis     Past Surgical History: Past Surgical History:  Procedure Laterality Date   ANKLE SURGERY Right 7- 10 yrs ago   APPENDECTOMY  3 yrs ago   CARPAL TUNNEL RELEASE Right 06/29/2024   Procedure: CARPAL TUNNEL RELEASE;  Surgeon: Claudene Penne ORN, MD;  Location: [facility];  Service: Neurosurgery;  Laterality: Right;  RIGHT CARPAL TUNNEL RELEASE WITH ULTRASOUND GUIDANCE, POSSIBLE CONVERT TO OPEN   CHOLECYSTECTOMY     COLONOSCOPY     KNEE SURGERY Bilateral  52yrs ago    Arthroscopic   TOTAL HIP ARTHROPLASTY Right 05/02/2013   Procedure: RIGHT TOTAL HIP ARTHROPLASTY ANTERIOR APPROACH;  Surgeon: Donnice JONETTA Car, MD;  Location: [facility];  Service: Orthopedics;  Laterality: Right;   TOTAL KNEE ARTHROPLASTY Right 09/26/2019   Procedure: TOTAL KNEE ARTHROPLASTY;  Surgeon: Car Donnice, MD;  Location: [facility];  Service: Orthopedics;  Laterality: Right;  70 mins   TOTAL KNEE ARTHROPLASTY Left 12/03/2020   Procedure: TOTAL KNEE ARTHROPLASTY;  Surgeon: Car Donnice, MD;  Location: [facility];  Service: Orthopedics;  Laterality: Left;     Allergies: Allergies as of 06/21/2024 - Review Complete 06/21/2024  Allergen Reaction Noted   Dilaudid  [hydromorphone ]  11/25/2020   Codeine Palpitations 10/08/2011    Medications:  Current Facility-Administered Medications:    0.9 %  sodium chloride  infusion, , Intravenous, Continuous, Piscitello, Fairy POUR, MD, Last Rate: 10 mL/hr at 08/01/24 0639, New Bag at 08/01/24 9360   ceFAZolin  (ANCEF ) IVPB 2g/100 mL premix, 2 g, Intravenous, Once, Claudene Penne ORN, MD  Social History: Social History   Tobacco Use   Smoking status: Former    Current packs/day: 0.00    Average packs/day: 1 pack/day for 10.0 years (10.0 ttl pk-yrs)    Types: Cigarettes    Start date: 03/30/1969    Quit date: 03/31/1979    Years since quitting: 45.3   Smokeless tobacco: Never  Vaping Use   Vaping status: Never  Used  Substance Use Topics   Alcohol use: Yes    Comment: very rare   Drug use: No    Family Medical History: Family History  Problem Relation Age of Onset   Diabetes Mother    Hypertension Mother    Breast cancer Mother 15   Hypertension Father    Diabetes Maternal Grandmother    Colon cancer Maternal Grandfather    Cancer Maternal Grandfather        rectal-colon   Heart disease Paternal Grandmother    Heart disease Paternal Grandfather     Physical Examination: Vitals:   08/01/24 0619  BP: (!) 159/78  Pulse: 65  Resp:  16  Temp: (!) 96.9 F (36.1 C)  SpO2: 98%    NEUROLOGICAL:     Awake, alert, oriented to person, place, and time.  Speech is clear and fluent.   Motor Exam:  She has definite wasting in the thenar eminence bilaterally.  She has weakness with her LOF muscles bilaterally.  These are approximately 4 out of 5 throughout.  C6 reflexes present bilaterally  She has some allodynia in the median nerve distribution bilaterally, however decreased to point discrimination when compared to her ulnar distribution bilaterally.  Medical Decision Making  Imaging: None  Electrodiagnostics: Full Name:     [name]                   Gender:               Female MRN #:           [id number]                      Date of Birth:      10-11-1951    Visit Date:                            06/07/2024 12:29 Age:                                     73 Years Examining Physician:        Onita Duos Referring Physician:          Dr. Mila Height:                                 5 feet 4 inch History: 73 year old female complains gradual onset bilateral hands numbness, weakness,   Summary of the test: Nerve conduction study: Right sural, superficial peroneal sensory responses were normal.   Right peroneal to EDB, tibial motor responses were normal.   Bilateral ulnar sensory and motor responses were within normal limit.  Bilateral radial sensory responses were normal.   Bilateral median sensory and motor responses were absent.   Electromyography: Selected needle examinations were performed at bilateral upper extremity muscles and cervical paraspinal muscles.   The abnormality is limited to bilateral abductor pollicis brevis, evidence of active denervation, and large complex motor unit potential with decreased recruitment patterns.     Conclusion: This is an abnormal study.  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  There is evidence of active denervation, axonal loss.       ------------------------------- Duos Onita. M.D. Ph.D.  [facility] [address] [place], [place] [address] Tel: [phone] Fax: [phone]  I have personally reviewed the images and electrodiagnostics and agree with the above interpretation.  Assessment and Plan: Ms. Gallion is a pleasant 73 y.o. female with carpal tunnel syndrome on the right hand carpal tunnel syndrome on the left.  She states that her right hand has becoming progressively worse over the past multiple years.  Its become acutely worse over the past 1 year.  She has noticed progressive loss of function bilaterally.  Loss of sensation causing her to drop things.  She is also had worsening waking symptoms.   Left-sided carpal tunnel syndrome -this is also electrodiagnostically severe.  Also has motor wasting.  Slightly less severe motor weakness on this side however still profound.  She had the contralateral side done previously, and is here now for the other side.   Thank you for involving me in the care of this patient.    Penne MICAEL Sharps MD/MSCR Neurosurgery - Peripheral Nerve Surgery

## 2024-08-01 NOTE — Discharge Instructions (Signed)

## 2024-08-01 NOTE — Op Note (Signed)
 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  Findings: Severe compression of the median nerve at the transverse carpal ligament  Preoperative Diagnosis:G56.02 Left carpal tunnel syndrome  Postoperative Diagnosis: G56.02 Left carpal tunnel syndrome   Postoperative Diagnosis: same   EBL: Minimal IVF: See anesthesia report Drains: none Disposition:Stable to PACU Complications: none  No foley catheter was placed.   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  They had tried rest, padding, and watchful waiting but had continued progressive symptoms.  Given the progression of her median neuropathy plan was made for median nerve decompression  Risk of surgery is discussed and include: Infection, bleeding, wound healing issues, pillar pain nerve injury, pain, failure to relieve the symptoms, need for further surgery.  Procedure:  1) left sided carpal tunnel decompression with ultrasound guidance   Procedure: After obtaining informed consent, the patient taken to the operating room, placed in supine position, monitored anesthesia care was induced.  They were given preoperative antibiotics.  Prepped and draped in the usual fashion.  Comprehensive timeout was performed verifying the patient's name, MRN, planned procedure.  An ultrasound was used with a sterile probe.  We used this to mark out our safety points including the interval between the ulnar artery and median nerve.  We identified the motor branch as well as the first sensory branch which were both in safe position.  We also identified the vascular arcade which was in safe positioning as well.  At this point we placed a block with Marcaine without epinephrine.  We blocked the skin where the incision would be, the superficial sensory median nerve, as well as the  transverse carpal ligament.  Under ultrasound guidance we utilized the local anesthetic to perform a hydrodissection of the nerve from the transverse carpal ligament.  We then prepped the sonexs ultra CTR knife on the back table while the anesthetic set in.  We performed a small linear incision approximately 2 to 3 mm.  We then utilized a Statistician under ultrasound guidance to identify the underside of the transverse carpal ligament.  The fat and connective tissue was dissected off the underside.  We could feel that this was quite thickened and calcified.  Causing severe compression.  Once we had a clear tract we then placed the ultrasound-guided knife into the incision and advanced it through the carpal tunnel.  We verified the safety zones including the median nerve which did not significantly cross over the knife.  We are able to see the first sensory branch which was not crossing the knife.  The artery was also in a safe place.  At this point we divided the transverse carpal ligament under ultrasound guidance, to get a full release it took 2 passes.  The nerve relaxed laterally and was well decompressed.  After the 2 passes we placed a [place] 4 into the wound and were able to feel a complete dissection of the transverse carpal ligament.  We then irrigated, we got meticulous hemostasis.  Skin glue was placed on the incision and a Band-Aid was placed on top once this was dried.  No immediate complications.  Sponge and pattie counts were correct at the end of the procedure.   I performed the procedure without an assistant surgeon  Lovenia Kim, MD/MSCR

## 2024-08-02 ENCOUNTER — Encounter: Payer: Self-pay | Admitting: Neurosurgery

## 2024-08-16 ENCOUNTER — Encounter: Payer: Self-pay | Admitting: Physician Assistant

## 2024-08-16 ENCOUNTER — Ambulatory Visit (INDEPENDENT_AMBULATORY_CARE_PROVIDER_SITE_OTHER): Admitting: Physician Assistant

## 2024-08-16 VITALS — BP 108/70 | Temp 97.8°F | Ht 65.0 in | Wt 192.4 lb

## 2024-08-16 DIAGNOSIS — G5601 Carpal tunnel syndrome, right upper limb: Secondary | ICD-10-CM

## 2024-08-16 DIAGNOSIS — G5602 Carpal tunnel syndrome, left upper limb: Secondary | ICD-10-CM

## 2024-08-16 DIAGNOSIS — Z09 Encounter for follow-up examination after completed treatment for conditions other than malignant neoplasm: Secondary | ICD-10-CM

## 2024-08-16 DIAGNOSIS — G5603 Carpal tunnel syndrome, bilateral upper limbs: Secondary | ICD-10-CM

## 2024-08-16 NOTE — Progress Notes (Signed)
   REFERRING PHYSICIAN:  Ransom Other, Md [address] [place],  [place] [address]  DOS: 06/29/24, right ultrasound-guided carpal tunnel release 08/01/24, left ultrasound-guided carpal tunnel release  HISTORY OF PRESENT ILLNESS: ALESSANDRIA HENKEN is 6 weeks status post right ultrasound-guided carpal tunnel release on 06/29/2024.  In 2 weeks status post left carpal tunnel release.  Overall, she is doing well.  Numbness and tingling have improved as well as her dexterity.  She still has some issues opening cans and jars, but it is much better than preoperatively.     PHYSICAL EXAMINATION:  NEUROLOGICAL:  General: In no acute distress.   Awake, alert, oriented to person, place, and time.  Pupils equal round and reactive to light.  Facial tone is symmetric.    Strength: Wasting continues to be noted in the thenar eminence bilaterally.  Median nerve and intrinsic muscles are 4/5 throughout. Incision c/d/I  Imaging:  No new imaging  Assessment / Plan: RUDELL MARLOWE is 6 weeks status post right ultrasound-guided carpal tunnel release on 06/29/2024.  In 2 weeks status post left carpal tunnel release.  Overall, she is doing well.  Numbness and tingling have improved as well as her dexterity.  She still has some issues opening cans and jars, but it is much better than preoperatively.    Advised to contact the office if any questions or concerns arise.   Lyle Decamp PA-C Dept of Neurosurgery

## 2024-09-11 ENCOUNTER — Encounter: Payer: Self-pay | Admitting: Neurosurgery

## 2024-09-11 ENCOUNTER — Ambulatory Visit: Admitting: Neurosurgery

## 2024-09-11 ENCOUNTER — Ambulatory Visit: Admitting: Podiatry

## 2024-09-11 VITALS — BP 112/76 | Temp 98.0°F | Wt 193.0 lb

## 2024-09-11 DIAGNOSIS — G5602 Carpal tunnel syndrome, left upper limb: Secondary | ICD-10-CM

## 2024-09-11 DIAGNOSIS — G5603 Carpal tunnel syndrome, bilateral upper limbs: Secondary | ICD-10-CM

## 2024-09-11 DIAGNOSIS — G5601 Carpal tunnel syndrome, right upper limb: Secondary | ICD-10-CM

## 2024-09-11 DIAGNOSIS — Z09 Encounter for follow-up examination after completed treatment for conditions other than malignant neoplasm: Secondary | ICD-10-CM

## 2024-09-11 NOTE — Progress Notes (Signed)
   REFERRING PHYSICIAN:  Ransom Other, Md [address] [place],  [place] [address]  DOS: 06/29/24, right ultrasound-guided carpal tunnel release 08/01/24, left ultrasound-guided carpal tunnel release  HISTORY OF PRESENT ILLNESS: [name] is status post decompression of her bilateral carpal tunnels.  Right side was performed first left side was performed later.  Overall she says she has had a major improvement in her quality of life.  She is able to sleep through the night without getting her hands to wake her up.  She is able to use her hands more for better dexterity as well as better grip.  Overall she is pleased.    PHYSICAL EXAMINATION:  NEUROLOGICAL:  General: In no acute distress.   Awake, alert, oriented to person, place, and time.  Pupils equal round and reactive to light.  Facial tone is symmetric.    Strength: Wasting continues to be noted in the thenar eminence bilaterally.  Median nerve and intrinsic muscles are 4/5 throughout. Incision c/d/I, she does appear to have improved strength although the thenar mass continues to be notable as expected  Imaging:  No new imaging  Assessment / Plan: [name] is status post decompression of her right and left carpal tunnel with bilateral carpal tunnel syndrome.  She has had a very good result with her decompression.  Improvement in her symptoms overall.  She is able to sleep through the night and using her hands more often.  She is also having improved dexterity.  Overall she is pleased with surgery.  At this point she can follow-up as needed.  I let her know to reach out should she have any questions or concerns or need anything from us  in the future.    Penne MICAEL Sharps, MD Dept of Neurosurgery

## 2024-09-18 ENCOUNTER — Ambulatory Visit (INDEPENDENT_AMBULATORY_CARE_PROVIDER_SITE_OTHER): Admitting: Podiatry

## 2024-09-18 DIAGNOSIS — Z91199 Patient's noncompliance with other medical treatment and regimen due to unspecified reason: Secondary | ICD-10-CM

## 2024-09-18 NOTE — Progress Notes (Signed)
 No show

## 2024-09-19 ENCOUNTER — Ambulatory Visit (INDEPENDENT_AMBULATORY_CARE_PROVIDER_SITE_OTHER)

## 2024-09-19 DIAGNOSIS — M21621 Bunionette of right foot: Secondary | ICD-10-CM | POA: Diagnosis not present

## 2024-09-19 DIAGNOSIS — M779 Enthesopathy, unspecified: Secondary | ICD-10-CM

## 2024-09-19 DIAGNOSIS — M19071 Primary osteoarthritis, right ankle and foot: Secondary | ICD-10-CM | POA: Diagnosis not present

## 2024-09-20 NOTE — Progress Notes (Signed)
  Subjective:  Patient ID: [name], female    DOB: 02-13-1951,  MRN: [id number]  Chief Complaint  Patient presents with   Bunions    Rm14 Painful tailors bunion with callous/pt also has pain right foot medial side of foot/2 years/ sore and tender with pressure    Discussed the use of AI scribe software for clinical note transcription with the patient, who gave verbal consent to proceed.  History of Present Illness [name] is a 73 year old female who presents with recurrent bunion pain and new foot pain.  She experiences recurrent pain at the site of the tailor's bunion on her right foot, she has had underlying hyperkeratotic lesion plantar to it shaved three times. The pain is located at the tailor's bunion and underneath it, worsening with pressure and ambulation. She has no diabetes.  She separately has occasional pain at the end of the day at her dorsal-medial hindfoot.       Objective:    Physical Exam MUSCULOSKELETAL: 5 out of 5 muscle strength in all major pedal muscle groups.  Rectus foot shape.  Tailor's bunion present with increased intermetatarsal 4-5.  There is plantar prominence with associated hyperkeratotic lesion.  Some thinning of fat pad.  No pain with fifth metatarsophalangeal joint range of motion.  Pain to palpation at the dorsomedial talonavicular joint, minor pain with range of motion. Neurovascular: Intact, palpable pedal pulses, intact protective sensation, intact gross sensation Derm: No open wounds or lesions.  There is a hyperkeratotic lesion plantar to the fifth metatarsal head.   No images are attached to the encounter.    Results RADIOLOGY Right foot X-ray: Increased intermetatarsal 4-5 angle with associated lateral deviation of the fifth metatarsal head.  Early arthritic changes of mild osteophyte formation, joint space narrowing at the talonavicular joint.  Joints otherwise well-maintained.  Fibular sesamoid bipartite.  No acute  osseous lesions or fracture. Ankle congruent without deformity. Minimal joint space narrowing and anterior osteophyte formation present.    Assessment:   1. Tailor's bunion of right foot   2. Tendinitis   3. Primary osteoarthritis of right foot      Plan:  Patient was evaluated and treated and all questions answered.  Assessment and Plan Assessment & Plan Right foot bunionette with associated pain Chronic bunionette with recurrent pain unresponsive to conservative measures. Surgery offers permanent correction. - Debrided hyperkeratotic lesion sharply utilizing 312 blade to patient satisfaction. No incident or bleeding.  - Provided pad to redistribute pressure. - Discussed conservative treatments for tailor's bunion: wider shoes, padding. - Discussed surgical option: osteotomy for long-term correction. Advised limited walking post-surgery with surgical shoe for 6-8 weeks. She will consider this and let us  know.   Talonavicular joint arthritis, right - Early arthritis pain to dorsomedial TN - Discussed use of supportive insoles to decrease strain on TN and likely improve pain    RTC as needed
